# Patient Record
Sex: Female | Born: 1960
Health system: Southern US, Community
[De-identification: ages and names within clinical notes are randomized; demographics above are authoritative.]

## PROBLEM LIST (undated history)

## (undated) DIAGNOSIS — G43909 Migraine, unspecified, not intractable, without status migrainosus: Secondary | ICD-10-CM

## (undated) DIAGNOSIS — E079 Disorder of thyroid, unspecified: Secondary | ICD-10-CM

## (undated) DIAGNOSIS — E785 Hyperlipidemia, unspecified: Secondary | ICD-10-CM

## (undated) DIAGNOSIS — F32A Depression, unspecified: Secondary | ICD-10-CM

## (undated) DIAGNOSIS — M8580 Other specified disorders of bone density and structure, unspecified site: Secondary | ICD-10-CM

## (undated) DIAGNOSIS — N200 Calculus of kidney: Secondary | ICD-10-CM

## (undated) HISTORY — DX: Migraine, unspecified, not intractable, without status migrainosus: G43.909

## (undated) HISTORY — DX: Hyperlipidemia, unspecified: E78.5

## (undated) HISTORY — DX: Calculus of kidney: N20.0

## (undated) HISTORY — PX: NECK SURGERY: SHX720

## (undated) HISTORY — DX: Depression, unspecified: F32.A

## (undated) HISTORY — DX: Disorder of thyroid, unspecified: E07.9

## (undated) HISTORY — DX: Other specified disorders of bone density and structure, unspecified site: M85.80

---
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
## 1998-06-01 ENCOUNTER — Other Ambulatory Visit: Admission: RE | Admit: 1998-06-01 | Discharge: 1998-06-01 | Payer: Self-pay | Admitting: Obstetrics and Gynecology

## 1999-05-26 ENCOUNTER — Other Ambulatory Visit: Admission: RE | Admit: 1999-05-26 | Discharge: 1999-05-26 | Payer: Self-pay | Admitting: Obstetrics and Gynecology

## 1999-06-30 ENCOUNTER — Encounter: Admission: RE | Admit: 1999-06-30 | Discharge: 1999-06-30 | Payer: Self-pay | Admitting: Obstetrics and Gynecology

## 1999-06-30 ENCOUNTER — Encounter: Payer: Self-pay | Admitting: Obstetrics and Gynecology

## 2000-05-30 ENCOUNTER — Other Ambulatory Visit: Admission: RE | Admit: 2000-05-30 | Discharge: 2000-05-30 | Payer: Self-pay | Admitting: Obstetrics and Gynecology

## 2001-05-15 ENCOUNTER — Encounter: Payer: Self-pay | Admitting: Obstetrics and Gynecology

## 2001-05-15 ENCOUNTER — Encounter: Admission: RE | Admit: 2001-05-15 | Discharge: 2001-05-15 | Payer: Self-pay | Admitting: Obstetrics and Gynecology

## 2001-06-05 ENCOUNTER — Other Ambulatory Visit: Admission: RE | Admit: 2001-06-05 | Discharge: 2001-06-05 | Payer: Self-pay | Admitting: Obstetrics and Gynecology

## 2002-06-11 ENCOUNTER — Other Ambulatory Visit: Admission: RE | Admit: 2002-06-11 | Discharge: 2002-06-11 | Payer: Self-pay | Admitting: Obstetrics and Gynecology

## 2002-09-10 ENCOUNTER — Encounter: Admission: RE | Admit: 2002-09-10 | Discharge: 2002-09-10 | Payer: Self-pay | Admitting: Obstetrics and Gynecology

## 2002-09-10 ENCOUNTER — Encounter: Payer: Self-pay | Admitting: Obstetrics and Gynecology

## 2003-06-16 ENCOUNTER — Other Ambulatory Visit: Admission: RE | Admit: 2003-06-16 | Discharge: 2003-06-16 | Payer: Self-pay | Admitting: Obstetrics and Gynecology

## 2004-04-20 ENCOUNTER — Ambulatory Visit (HOSPITAL_COMMUNITY): Admission: RE | Admit: 2004-04-20 | Discharge: 2004-04-20 | Payer: Self-pay | Admitting: Obstetrics and Gynecology

## 2004-07-12 ENCOUNTER — Other Ambulatory Visit: Admission: RE | Admit: 2004-07-12 | Discharge: 2004-07-12 | Payer: Self-pay | Admitting: Obstetrics and Gynecology

## 2004-07-14 ENCOUNTER — Ambulatory Visit: Payer: Self-pay | Admitting: Gastroenterology

## 2004-08-10 ENCOUNTER — Ambulatory Visit: Payer: Self-pay | Admitting: Gastroenterology

## 2004-08-19 ENCOUNTER — Ambulatory Visit: Payer: Self-pay | Admitting: Gastroenterology

## 2005-07-20 ENCOUNTER — Other Ambulatory Visit: Admission: RE | Admit: 2005-07-20 | Discharge: 2005-07-20 | Payer: Self-pay | Admitting: Obstetrics and Gynecology

## 2005-08-09 ENCOUNTER — Ambulatory Visit (HOSPITAL_COMMUNITY): Admission: RE | Admit: 2005-08-09 | Discharge: 2005-08-09 | Payer: Self-pay | Admitting: Obstetrics and Gynecology

## 2006-08-29 ENCOUNTER — Other Ambulatory Visit: Admission: RE | Admit: 2006-08-29 | Discharge: 2006-08-29 | Payer: Self-pay | Admitting: Obstetrics and Gynecology

## 2008-03-25 ENCOUNTER — Other Ambulatory Visit: Admission: RE | Admit: 2008-03-25 | Discharge: 2008-03-25 | Payer: Self-pay | Admitting: Obstetrics and Gynecology

## 2008-04-02 ENCOUNTER — Encounter: Admission: RE | Admit: 2008-04-02 | Discharge: 2008-04-02 | Payer: Self-pay | Admitting: Obstetrics and Gynecology

## 2009-07-06 ENCOUNTER — Encounter (INDEPENDENT_AMBULATORY_CARE_PROVIDER_SITE_OTHER): Payer: Self-pay | Admitting: *Deleted

## 2009-10-14 ENCOUNTER — Encounter (INDEPENDENT_AMBULATORY_CARE_PROVIDER_SITE_OTHER): Payer: Self-pay | Admitting: *Deleted

## 2010-03-03 ENCOUNTER — Telehealth: Payer: Self-pay | Admitting: Gastroenterology

## 2010-06-30 NOTE — Letter (Signed)
Summary: Colonoscopy Letter  [facility]  [address] [place], [place] [address]   Phone: [phone]  Fax: [phone]      Oct 14, 2009 MRN: [id number]   [facility] [address] [place], [place]  [address]   Dear Ms. Bouchillon,   According to your medical record, it is time for you to schedule a Colonoscopy. The American Cancer Society recommends this procedure as a method to detect early colon cancer. Patients with a family history of colon cancer, or a personal history of colon polyps or inflammatory bowel disease are at increased risk.  This letter has beeen generated based on the recommendations made at the time of your procedure. If you feel that in your particular situation this may no longer apply, please contact our office.  Please call our office at [phone] to schedule this appointment or to update your records at your earliest convenience.  Thank you for cooperating with [place] to provide you with the very best care possible.   Sincerely,    Vania Rea. Jarold Motto, M.D.  [facility] Gastroenterology Division [phone]

## 2010-06-30 NOTE — Letter (Signed)
Summary: Colonoscopy Letter  [facility]  [address] [place], [place] [address]   Phone: [phone]  Fax: [phone]      July 06, 2009 MRN: [id number]   [facility] [address] [place], [place]  [address]   Dear Ms. Ricciardi,   According to your medical record, it is time for you to schedule a Colonoscopy. The American Cancer Society recommends this procedure as a method to detect early colon cancer. Patients with a family history of colon cancer, or a personal history of colon polyps or inflammatory bowel disease are at increased risk.  This letter has beeen generated based on the recommendations made at the time of your procedure. If you feel that in your particular situation this may no longer apply, please contact our office.  Please call our office at [phone] to schedule this appointment or to update your records at your earliest convenience.  Thank you for cooperating with [place] to provide you with the very best care possible.   Sincerely,  Vania Rea. Jarold Motto, M.D.  [facility] Gastroenterology Division [phone]

## 2010-06-30 NOTE — Progress Notes (Signed)
Summary: Schedule Colonoscopy  Phone Note Outgoing Call Call back at [facility] Phone [phone]   Call placed by: Harlow Mares CMA Duncan Dull),  March 03, 2010 3:13 PM Call placed to: Patient Summary of Call: Left a message on patients machine to call back. patient due for a colonoscopy Initial call taken by: Harlow Mares CMA Duncan Dull),  March 03, 2010 3:13 PM  Follow-up for Phone Call        Left a message on the patient machine to call back and schedule a previsit and procedure with our office. A letter will be mailed to the patient.   Follow-up by: Harlow Mares CMA Duncan Dull),  March 11, 2010 11:34 AM

## 2011-08-16 ENCOUNTER — Encounter: Payer: Self-pay | Admitting: Family Medicine

## 2011-08-23 ENCOUNTER — Ambulatory Visit (INDEPENDENT_AMBULATORY_CARE_PROVIDER_SITE_OTHER): Payer: 59 | Admitting: Family Medicine

## 2011-08-23 ENCOUNTER — Encounter: Payer: Self-pay | Admitting: Family Medicine

## 2011-08-23 VITALS — BP 108/69 | HR 61 | Temp 98.0°F | Resp 16 | Ht 67.0 in | Wt 149.6 lb

## 2011-08-23 DIAGNOSIS — Z0000 Encounter for general adult medical examination without abnormal findings: Secondary | ICD-10-CM

## 2011-08-23 LAB — CBC WITH DIFFERENTIAL/PLATELET
Basophils Absolute: 0 10*3/uL (ref 0.0–0.1)
Basophils Relative: 0 % (ref 0–1)
Eosinophils Absolute: 0.2 10*3/uL (ref 0.0–0.7)
Eosinophils Relative: 3 % (ref 0–5)
HCT: 41.9 % (ref 36.0–46.0)
Hemoglobin: 14 g/dL (ref 12.0–15.0)
Lymphocytes Relative: 33 % (ref 12–46)
Lymphs Abs: 1.9 10*3/uL (ref 0.7–4.0)
MCH: 31.7 pg (ref 26.0–34.0)
MCHC: 33.4 g/dL (ref 30.0–36.0)
MCV: 94.8 fL (ref 78.0–100.0)
Monocytes Absolute: 0.4 10*3/uL (ref 0.1–1.0)
Monocytes Relative: 8 % (ref 3–12)
Neutro Abs: 3.2 10*3/uL (ref 1.7–7.7)
Neutrophils Relative %: 57 % (ref 43–77)
Platelets: 289 10*3/uL (ref 150–400)
RBC: 4.42 MIL/uL (ref 3.87–5.11)
RDW: 13.6 % (ref 11.5–15.5)
WBC: 5.7 10*3/uL (ref 4.0–10.5)

## 2011-08-23 LAB — COMPREHENSIVE METABOLIC PANEL
ALT: 20 U/L (ref 0–35)
AST: 22 U/L (ref 0–37)
Albumin: 4.4 g/dL (ref 3.5–5.2)
Alkaline Phosphatase: 44 U/L (ref 39–117)
BUN: 13 mg/dL (ref 6–23)
CO2: 27 mEq/L (ref 19–32)
Calcium: 9.5 mg/dL (ref 8.4–10.5)
Chloride: 105 mEq/L (ref 96–112)
Creat: 0.79 mg/dL (ref 0.50–1.10)
Glucose, Bld: 78 mg/dL (ref 70–99)
Potassium: 4.5 mEq/L (ref 3.5–5.3)
Sodium: 141 mEq/L (ref 135–145)
Total Bilirubin: 0.5 mg/dL (ref 0.3–1.2)
Total Protein: 6.7 g/dL (ref 6.0–8.3)

## 2011-08-23 LAB — LIPID PANEL
LDL Cholesterol: 122 mg/dL — ABNORMAL HIGH (ref 0–99)
Total CHOL/HDL Ratio: 3.6 Ratio
VLDL: 14 mg/dL (ref 0–40)

## 2011-08-23 NOTE — Patient Instructions (Signed)
Shingles Vaccine What You Need to Know WHAT IS SHINGLES?  Shingles is a painful skin rash, often with blisters. It is also called Herpes Zoster or just Zoster.   A shingles rash usually appears on one side of the face or body and lasts from 2 to 4 weeks. Its main symptom is pain, which can be quite severe. Other symptoms of shingles can include fever, headache, chills, and upset stomach. Very rarely, a shingles infection can lead to pneumonia, hearing problems, blindness, brain inflammation (encephalitis), or death.   For about 1 person in 5, severe pain can continue even after the rash clears up. This is called post-herpetic neuralgia.   Shingles is caused by the Varicella Zoster virus. This is the same virus that causes chickenpox. Only someone who has had a case of chickenpox or rarely, has gotten chickenpox vaccine, can get shingles. The virus stays in your body. It can reappear many years later to cause a case of shingles.   You cannot catch shingles from another person with shingles. However, a person who has never had chickenpox (or chickenpox vaccine) could get chickenpox from someone with shingles. This is not very common.   Shingles is far more common in people 50 and older than in younger people. It is also more common in people whose immune systems are weakened because of a disease such as cancer or drugs such as steroids or chemotherapy.   At least 1 million people get shingles per year in the [place].  SHINGLES VACCINE  A vaccine for shingles was licensed in 2006. In clinical trials, the vaccine reduced the risk of shingles by 50%. It can also reduce the pain in people who still get shingles after being vaccinated.   A single dose of shingles vaccine is recommended for adults 60 years of age and older.  SOME PEOPLE SHOULD NOT GET SHINGLES VACCINE OR SHOULD WAIT A person should not get shingles vaccine if he or she:  Has ever had a life-threatening allergic reaction to  gelatin, the antibiotic neomycin, or any other component of shingles vaccine. Tell your caregiver if you have any severe allergies.   Has a weakened immune system because of current:   AIDS or another disease that affects the immune system.   Treatment with drugs that affect the immune system, such as prolonged use of high-dose steroids.   Cancer treatment, such as radiation or chemotherapy.   Cancer affecting the bone marrow or lymphatic system, such as leukemia or lymphoma.   Is pregnant, or might be pregnant. Women should not become pregnant until at least 4 weeks after getting shingles vaccine.  Someone with a minor illness, such as a cold, may be vaccinated. Anyone with a moderate or severe acute illness should usually wait until he or she recovers before getting the vaccine. This includes anyone with a temperature of 101.3 F (38 C) or higher. WHAT ARE THE RISKS FROM SHINGLES VACCINE?  A vaccine, like any medicine, could possibly cause serious problems, such as severe allergic reactions. However, the risk of a vaccine causing serious harm, or death, is extremely small.   No serious problems have been identified with shingles vaccine.  Mild Problems  Redness, soreness, swelling, or itching at the site of the injection (about 1 person in 3).   Headache (about 1 person in 70).  Like all vaccines, shingles vaccine is being closely monitored for unusual or severe problems. WHAT IF THERE IS A MODERATE OR SEVERE REACTION? What should I   look for? Any unusual condition, such as a severe allergic reaction or a high fever. If a severe allergic reaction occurred, it would be within a few minutes to an hour after the shot. Signs of a serious allergic reaction can include difficulty breathing, weakness, hoarseness or wheezing, a fast heartbeat, hives, dizziness, paleness, or swelling of the throat. What should I do?  Call your caregiver, or get the person to a caregiver right away.   Tell  the caregiver what happened, the date and time it happened, and when the vaccination was given.   Ask the caregiver to report the reaction by filing a Vaccine Adverse Event Reporting System (VAERS) form. Or, you can file this report through the VAERS web site at www.vaers.hhs.gov or by calling 1-800-822-7967.  VAERS does not provide medical advice. HOW CAN I LEARN MORE?  Ask your caregiver. He or she can give you the vaccine package insert or suggest other sources of information.   Contact the Centers for Disease Control and Prevention (CDC):   Call 1-800-232-4636 (1-800-CDC-INFO).   Visit the CDC website at www.cdc.gov/vaccines  CDC Shingles Vaccine VIS (03/03/08) Document Released: 03/12/2006 Document Revised: 05/04/2011 Document Reviewed: 03/03/2008 ExitCare Patient Information 2012 [employer]. 

## 2011-08-23 NOTE — Progress Notes (Signed)
  Subjective:    Patient ID: Katie Bond, female    DOB: February 14, 1961, 51 y.o.   MRN: [id number]  HPI  This 51 y.o. Cauc female is here for annual physical exam; she receives GYN care at [facility] with Dr. Genice Rouge (last PAP 2 years ago). She has some minor problems with rashes  but otherwise, is in very good health.    HCM:   Mammogram- 3-4 years ago (negative)               DEXA- 3 years ago (normal)               Colonoscopy: 2-3 years ago (normal)    Review of Systems  HENT: Negative.   Eyes: Negative.   Respiratory: Negative.   Cardiovascular: Negative.   Gastrointestinal: Negative.   Genitourinary: Negative.   Musculoskeletal: Negative.   Skin: Negative.   Neurological: Negative.   Hematological: Negative.   Psychiatric/Behavioral: Negative.        Objective:   Physical Exam  Nursing note and vitals reviewed. Constitutional: She is oriented to person, place, and time. She appears well-developed and well-nourished. No distress.  HENT:  Head: Normocephalic and atraumatic.  Right Ear: External ear normal.  Nose: Nose normal.  Mouth/Throat: Oropharynx is clear and moist.  Eyes: Conjunctivae and EOM are normal. Pupils are equal, round, and reactive to light. No scleral icterus.  Neck: Normal range of motion. Neck supple. No thyromegaly present.  Cardiovascular: Normal rate, regular rhythm, normal heart sounds and intact distal pulses.  Exam reveals no gallop.   No murmur heard. Pulmonary/Chest: Effort normal and breath sounds normal. No respiratory distress.  Abdominal: Soft. Bowel sounds are normal. She exhibits no distension and no mass. There is no tenderness.  Musculoskeletal: Normal range of motion. She exhibits no edema and no tenderness.  Lymphadenopathy:    She has no cervical adenopathy.  Neurological: She is alert and oriented to person, place, and time. She has normal reflexes. No cranial nerve deficit. Coordination normal.  Skin: Skin is  warm and dry.  Psychiatric: She has a normal mood and affect. Her behavior is normal. Judgment and thought content normal.          Assessment & Plan:   1. Routine general medical examination at a health care facility  CBC with Differential, Comprehensive metabolic panel, Lipid panel, Vitamin D 25 hydroxy   RX:  Zostavax x 1 dose given to pt.

## 2011-08-24 LAB — VITAMIN D 25 HYDROXY (VIT D DEFICIENCY, FRACTURES): Vit D, 25-Hydroxy: 36 ng/mL (ref 30–89)

## 2011-08-28 ENCOUNTER — Encounter: Payer: Self-pay | Admitting: Gastroenterology

## 2011-08-31 ENCOUNTER — Encounter: Payer: Self-pay | Admitting: Family Medicine

## 2011-08-31 DIAGNOSIS — Z0000 Encounter for general adult medical examination without abnormal findings: Secondary | ICD-10-CM | POA: Insufficient documentation

## 2011-08-31 DIAGNOSIS — Z87440 Personal history of urinary (tract) infections: Secondary | ICD-10-CM | POA: Insufficient documentation

## 2011-09-05 NOTE — Progress Notes (Signed)
Quick Note:  Please call pt and advise that the following labs are abnormal... LDL ("bad") cholesterol is a little elevated; improve nutrition by reducing fats and cholesterol in diet and increase activity level.  Vitamin D is in low normal range; a multivitamin with additional Vit D is a good idea. Also need to get 10-15 minutes of sun exposure most days of the week.  Try to eat more salmon and tuna and some dairy products that have been fortified with Vit D.  Provide copy of labs to pt. ______

## 2011-09-06 ENCOUNTER — Telehealth: Payer: Self-pay

## 2011-09-06 NOTE — Telephone Encounter (Signed)
pts is calling for lab results can now be reached at home number [phone]

## 2011-09-06 NOTE — Telephone Encounter (Signed)
Patient was seen for a physical and wants to know lab results. Please call back at [phone] (cell )

## 2011-09-06 NOTE — Telephone Encounter (Signed)
lmom to CB 

## 2011-09-07 NOTE — Telephone Encounter (Signed)
Katie Bond spoke with pt this am and discussed labs, please see lab results.

## 2011-10-25 ENCOUNTER — Other Ambulatory Visit: Payer: Self-pay | Admitting: Obstetrics and Gynecology

## 2011-10-25 DIAGNOSIS — Z1231 Encounter for screening mammogram for malignant neoplasm of breast: Secondary | ICD-10-CM

## 2011-11-10 ENCOUNTER — Ambulatory Visit
Admission: RE | Admit: 2011-11-10 | Discharge: 2011-11-10 | Disposition: A | Discharge status: Home / Self Care | Payer: 59 | Source: Ambulatory Visit | Attending: Obstetrics and Gynecology | Admitting: Obstetrics and Gynecology

## 2011-11-10 DIAGNOSIS — Z1231 Encounter for screening mammogram for malignant neoplasm of breast: Secondary | ICD-10-CM

## 2012-10-16 ENCOUNTER — Encounter: Payer: Self-pay | Admitting: Certified Nurse Midwife

## 2012-10-22 ENCOUNTER — Encounter: Payer: Self-pay | Admitting: Certified Nurse Midwife

## 2012-10-22 ENCOUNTER — Ambulatory Visit (INDEPENDENT_AMBULATORY_CARE_PROVIDER_SITE_OTHER): Payer: 59 | Admitting: Certified Nurse Midwife

## 2012-10-22 VITALS — BP 94/52 | HR 60 | Ht 66.0 in | Wt 149.0 lb

## 2012-10-22 DIAGNOSIS — Z0000 Encounter for general adult medical examination without abnormal findings: Secondary | ICD-10-CM

## 2012-10-22 DIAGNOSIS — Z01419 Encounter for gynecological examination (general) (routine) without abnormal findings: Secondary | ICD-10-CM

## 2012-10-22 LAB — POCT URINALYSIS DIPSTICK
Bilirubin, UA: NEGATIVE
Blood, UA: NEGATIVE
Glucose, UA: NEGATIVE
Ketones, UA: NEGATIVE
Nitrite, UA: NEGATIVE
Protein, UA: NEGATIVE
Urobilinogen, UA: NEGATIVE
pH, UA: 5

## 2012-10-22 NOTE — Patient Instructions (Addendum)
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
## 2012-10-23 LAB — HEMOGLOBIN, FINGERSTICK: Hemoglobin, fingerstick: 13.2 g/dL (ref 12.0–16.0)

## 2013-04-17 ENCOUNTER — Telehealth: Payer: Self-pay | Admitting: Pulmonary Disease

## 2013-04-17 NOTE — Telephone Encounter (Signed)
I spoke with pt and advised her SN is not accepting new pts d/t retiring next year. Nothing further needed

## 2013-06-04 ENCOUNTER — Telehealth: Payer: Self-pay

## 2013-06-04 ENCOUNTER — Telehealth: Payer: Self-pay | Admitting: Certified Nurse Midwife

## 2013-06-04 NOTE — Telephone Encounter (Signed)
Spoke with patient. Last CPE was March 27,2013. Transferred to appt center to schedule CPE appt.

## 2013-06-04 NOTE — Telephone Encounter (Signed)
Patient wants to know who Debbi would recommend for a PCP

## 2013-06-04 NOTE — Telephone Encounter (Signed)
That is fine 

## 2013-06-04 NOTE — Telephone Encounter (Signed)
NEEDS TO KNOW DATE OF LAST CPE. CB# [phone]

## 2013-06-04 NOTE — Telephone Encounter (Signed)
Return call to patient, notified our practice frequently recommends Dr Lynne LoganPang's office and Dr Rosezella FloridaPanosh's office and numbers given.   Is this ok or is there another recommendation?

## 2013-06-09 ENCOUNTER — Ambulatory Visit (INDEPENDENT_AMBULATORY_CARE_PROVIDER_SITE_OTHER): Payer: 59 | Admitting: Physician Assistant

## 2013-06-09 ENCOUNTER — Encounter: Payer: Self-pay | Admitting: Physician Assistant

## 2013-06-09 VITALS — BP 118/62 | HR 72 | Temp 98.0°F | Resp 16 | Ht 66.0 in | Wt 147.0 lb

## 2013-06-09 DIAGNOSIS — M79609 Pain in unspecified limb: Secondary | ICD-10-CM

## 2013-06-09 DIAGNOSIS — S161XXA Strain of muscle, fascia and tendon at neck level, initial encounter: Secondary | ICD-10-CM

## 2013-06-09 DIAGNOSIS — M79602 Pain in left arm: Secondary | ICD-10-CM

## 2013-06-09 DIAGNOSIS — S139XXA Sprain of joints and ligaments of unspecified parts of neck, initial encounter: Secondary | ICD-10-CM

## 2013-06-09 DIAGNOSIS — Z0000 Encounter for general adult medical examination without abnormal findings: Secondary | ICD-10-CM

## 2013-06-09 LAB — LIPID PANEL
CHOL/HDL RATIO: 3.4 ratio
Cholesterol: 178 mg/dL (ref 0–200)
HDL: 53 mg/dL (ref >39)
LDL CALC: 112 mg/dL — AB (ref 0–99)
TRIGLYCERIDES: 65 mg/dL (ref <150)
VLDL: 13 mg/dL (ref 0–40)

## 2013-06-09 LAB — COMPREHENSIVE METABOLIC PANEL
ALK PHOS: 41 U/L (ref 39–117)
ALT: 24 U/L (ref 0–35)
AST: 22 U/L (ref 0–37)
Albumin: 4.4 g/dL (ref 3.5–5.2)
BILIRUBIN TOTAL: 0.6 mg/dL (ref 0.3–1.2)
BUN: 11 mg/dL (ref 6–23)
CO2: 29 mEq/L (ref 19–32)
Calcium: 9.6 mg/dL (ref 8.4–10.5)
Chloride: 106 mEq/L (ref 96–112)
Creat: 0.71 mg/dL (ref 0.50–1.10)
GLUCOSE: 87 mg/dL (ref 70–99)
Potassium: 4.4 mEq/L (ref 3.5–5.3)
SODIUM: 141 meq/L (ref 135–145)
TOTAL PROTEIN: 6.8 g/dL (ref 6.0–8.3)

## 2013-06-09 LAB — POCT UA - MICROSCOPIC ONLY
Bacteria, U Microscopic: NEGATIVE
CRYSTALS, UR, HPF, POC: NEGATIVE
Casts, Ur, LPF, POC: NEGATIVE
Mucus, UA: NEGATIVE
RBC, URINE, MICROSCOPIC: NEGATIVE
Yeast, UA: NEGATIVE

## 2013-06-09 LAB — CBC
HCT: 41.1 % (ref 36.0–46.0)
HEMOGLOBIN: 13.8 g/dL (ref 12.0–15.0)
MCH: 30.4 pg (ref 26.0–34.0)
MCHC: 33.6 g/dL (ref 30.0–36.0)
MCV: 90.5 fL (ref 78.0–100.0)
Platelets: 290 10*3/uL (ref 150–400)
RBC: 4.54 MIL/uL (ref 3.87–5.11)
RDW: 14.2 % (ref 11.5–15.5)
WBC: 4.3 10*3/uL (ref 4.0–10.5)

## 2013-06-09 LAB — POCT URINALYSIS DIPSTICK
Bilirubin, UA: NEGATIVE
Glucose, UA: NEGATIVE
KETONES UA: NEGATIVE
Nitrite, UA: NEGATIVE
PROTEIN UA: NEGATIVE
Spec Grav, UA: 1.01
UROBILINOGEN UA: 0.2
pH, UA: 7.5

## 2013-06-09 LAB — TSH: TSH: 2.817 u[IU]/mL (ref 0.350–4.500)

## 2013-06-09 LAB — SEDIMENTATION RATE: SED RATE: 4 mm/h (ref 0–22)

## 2013-06-09 MED ORDER — CYCLOBENZAPRINE HCL 5 MG PO TABS: 5.0000 mg | ORAL_TABLET | Freq: Three times a day (TID) | ORAL | Status: DC | PRN
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
## 2013-06-09 NOTE — Patient Instructions (Signed)

## 2013-06-10 LAB — VITAMIN D 25 HYDROXY (VIT D DEFICIENCY, FRACTURES): Vit D, 25-Hydroxy: 33 ng/mL (ref 30–89)

## 2013-06-18 ENCOUNTER — Encounter: Payer: Self-pay | Admitting: Certified Nurse Midwife

## 2013-07-03 ENCOUNTER — Ambulatory Visit (HOSPITAL_COMMUNITY)
Admission: RE | Admit: 2013-07-03 | Discharge: 2013-07-03 | Disposition: A | Discharge status: Home / Self Care | Payer: 59 | Source: Ambulatory Visit | Attending: Physician Assistant | Admitting: Physician Assistant

## 2013-07-03 ENCOUNTER — Other Ambulatory Visit: Payer: Self-pay | Admitting: Physician Assistant

## 2013-07-03 DIAGNOSIS — Z0000 Encounter for general adult medical examination without abnormal findings: Secondary | ICD-10-CM

## 2013-07-03 DIAGNOSIS — Z1231 Encounter for screening mammogram for malignant neoplasm of breast: Secondary | ICD-10-CM

## 2013-10-23 ENCOUNTER — Ambulatory Visit: Payer: 59 | Admitting: Certified Nurse Midwife

## 2013-11-04 ENCOUNTER — Ambulatory Visit (INDEPENDENT_AMBULATORY_CARE_PROVIDER_SITE_OTHER): Payer: 59 | Admitting: Certified Nurse Midwife

## 2013-11-04 ENCOUNTER — Encounter: Payer: Self-pay | Admitting: Certified Nurse Midwife

## 2013-11-04 VITALS — BP 112/68 | HR 80 | Resp 16 | Ht 66.25 in | Wt 151.0 lb

## 2013-11-04 DIAGNOSIS — Z01419 Encounter for gynecological examination (general) (routine) without abnormal findings: Secondary | ICD-10-CM

## 2013-11-04 DIAGNOSIS — Z124 Encounter for screening for malignant neoplasm of cervix: Secondary | ICD-10-CM

## 2013-11-04 NOTE — Patient Instructions (Signed)
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
## 2013-11-06 LAB — IPS PAP TEST WITH REFLEX TO HPV

## 2013-11-08 NOTE — Progress Notes (Signed)
Reviewed personally.  M. Suzanne Malakai Schoenherr, MD.  

## 2014-02-07 ENCOUNTER — Ambulatory Visit (INDEPENDENT_AMBULATORY_CARE_PROVIDER_SITE_OTHER): Payer: 59 | Admitting: Family Medicine

## 2014-02-07 VITALS — BP 130/78 | HR 79 | Temp 98.1°F | Resp 16 | Ht 66.0 in | Wt 149.6 lb

## 2014-02-07 DIAGNOSIS — R300 Dysuria: Secondary | ICD-10-CM

## 2014-02-07 DIAGNOSIS — N3090 Cystitis, unspecified without hematuria: Secondary | ICD-10-CM

## 2014-02-07 LAB — POCT URINALYSIS DIPSTICK
BILIRUBIN UA: NEGATIVE
GLUCOSE UA: NEGATIVE
Ketones, UA: NEGATIVE
Nitrite, UA: NEGATIVE
Protein, UA: NEGATIVE
Urobilinogen, UA: 0.2
pH, UA: 5

## 2014-02-07 LAB — POCT UA - MICROSCOPIC ONLY
BACTERIA, U MICROSCOPIC: NEGATIVE
Casts, Ur, LPF, POC: NEGATIVE
Crystals, Ur, HPF, POC: NEGATIVE
Mucus, UA: NEGATIVE
RBC, URINE, MICROSCOPIC: NEGATIVE
Yeast, UA: NEGATIVE

## 2014-02-07 MED ORDER — NITROFURANTOIN MONOHYD MACRO 100 MG PO CAPS: 100.0000 mg | ORAL_CAPSULE | Freq: Two times a day (BID) | ORAL | Status: DC

## 2014-02-07 NOTE — Patient Instructions (Signed)

## 2014-02-07 NOTE — Progress Notes (Signed)
   Subjective:    Patient ID: Katie Bond, female    DOB: 02/19/1961, 53 y.o.   MRN: [id number]  HPI Patient presents today with 12 hours of dysuria, urinary frequency. She has a history of UTIs, but hasn't had any for about 2 years. She had a couple of loose stools a couple of days ago following eating some spicy foods.    Review of Systems Few chills this morning, no fever, no back pain, no abdominal pain, slight nausea (attributes this to not having eaten this morning). No vaginal discharge, burning or itching.    Objective:   Physical Exam  Vitals reviewed. Constitutional: She is oriented to person, place, and time. She appears well-developed and well-nourished.  HENT:  Head: Normocephalic and atraumatic.  Eyes: Conjunctivae are normal.  Neck: Normal range of motion. Neck supple.  Cardiovascular: Normal rate, regular rhythm and normal heart sounds.   Pulmonary/Chest: Effort normal and breath sounds normal.  Abdominal: Soft. Bowel sounds are normal. There is tenderness in the suprapubic area. There is no CVA tenderness.  Musculoskeletal: Normal range of motion.  Neurological: She is alert and oriented to person, place, and time.  Skin: Skin is warm and dry.  Psychiatric: She has a normal mood and affect. Her behavior is normal. Judgment and thought content normal.   Results for orders placed in visit on 02/07/14  POCT UA - MICROSCOPIC ONLY      Result Value Ref Range   WBC, Ur, HPF, POC 0-1     RBC, urine, microscopic neg     Bacteria, U Microscopic neg     Mucus, UA neg     Epithelial cells, urine per micros 0-1     Crystals, Ur, HPF, POC neg     Casts, Ur, LPF, POC neg     Yeast, UA neg    POCT URINALYSIS DIPSTICK      Result Value Ref Range   Color, UA yellow     Clarity, UA clear     Glucose, UA neg     Bilirubin, UA neg     Ketones, UA neg     Spec Grav, UA <=1.005     Blood, UA trace-intact     pH, UA 5.0     Protein, UA neg     Urobilinogen, UA 0.2     Nitrite, UA neg     Leukocytes, UA Trace        Assessment & Plan:  1. Dysuria - POCT UA - Microscopic Only - POCT urinalysis dipstick  2. Cystitis -Urine with trace blood, trace leukocytes.  - Urine culture - nitrofurantoin, macrocrystal-monohydrate, (MACROBID) 100 MG capsule; Take 1 capsule (100 mg total) by mouth 2 (two) times daily.  Dispense: 20 capsule; Refill: 0 -Provided written and verbal information regarding diagnosis and treatment.  Emi Belfast, FNP-BC  [facility], [facility]  02/07/2014 8:45 AM

## 2014-02-10 LAB — URINE CULTURE

## 2014-03-05 ENCOUNTER — Ambulatory Visit (INDEPENDENT_AMBULATORY_CARE_PROVIDER_SITE_OTHER): Payer: 59 | Admitting: Emergency Medicine

## 2014-03-05 VITALS — BP 118/68 | HR 75 | Temp 98.5°F | Resp 18 | Ht 66.0 in | Wt 150.0 lb

## 2014-03-05 DIAGNOSIS — N3090 Cystitis, unspecified without hematuria: Secondary | ICD-10-CM

## 2014-03-05 DIAGNOSIS — R350 Frequency of micturition: Secondary | ICD-10-CM

## 2014-03-05 DIAGNOSIS — H9319 Tinnitus, unspecified ear: Secondary | ICD-10-CM

## 2014-03-05 LAB — POCT URINALYSIS DIPSTICK
Bilirubin, UA: NEGATIVE
Blood, UA: NEGATIVE
Glucose, UA: NEGATIVE
Ketones, UA: NEGATIVE
Nitrite, UA: NEGATIVE
Protein, UA: NEGATIVE
Spec Grav, UA: 1.01
UROBILINOGEN UA: 0.2
pH, UA: 7

## 2014-03-05 LAB — POCT UA - MICROSCOPIC ONLY
Bacteria, U Microscopic: NEGATIVE
Casts, Ur, LPF, POC: NEGATIVE
Crystals, Ur, HPF, POC: NEGATIVE
EPITHELIAL CELLS, URINE PER MICROSCOPY: NEGATIVE
MUCUS UA: NEGATIVE
RBC, urine, microscopic: NEGATIVE
YEAST UA: NEGATIVE

## 2014-03-05 MED ORDER — PHENAZOPYRIDINE HCL 200 MG PO TABS: 200.0000 mg | ORAL_TABLET | Freq: Three times a day (TID) | ORAL | Status: DC | PRN

## 2014-03-05 MED ORDER — NITROFURANTOIN MONOHYD MACRO 100 MG PO CAPS: 100.0000 mg | ORAL_CAPSULE | Freq: Two times a day (BID) | ORAL | Status: DC

## 2014-03-05 NOTE — Patient Instructions (Signed)
Tinnitus °Sounds you hear in your ears and coming from within the ear is called tinnitus. This can be a symptom of many ear disorders. It is often associated with hearing loss.  °Tinnitus can be seen with: °· Infections. °· Ear blockages such as wax buildup. °· Meniere's disease. °· Ear damage. °· Inherited. °· Occupational causes. °While irritating, it is not usually a threat to health. When the cause of the tinnitus is wax, infection in the middle ear, or foreign body it is easily treated. Hearing loss will usually be reversible.  °TREATMENT  °When treating the underlying cause does not get rid of tinnitus, it may be necessary to get rid of the unwanted sound by covering it up with more pleasant background noises. This may include music, the radio etc. There are tinnitus maskers which can be worn which produce background noise to cover up the tinnitus. °Avoid all medications which tend to make tinnitus worse such as alcohol, caffeine, aspirin, and nicotine. There are many soothing background tapes such as rain, ocean, thunderstorms, etc. These soothing sounds help with sleeping or resting. °Keep all follow-up appointments and referrals. This is important to identify the cause of the problem. It also helps avoid complications, impaired hearing, disability, or chronic pain. °Document Released: 05/15/2005 Document Revised: 08/07/2011 Document Reviewed: 01/01/2008 °ExitCare® Patient Information ©2015 [employer]. This information is not intended to replace advice given to you by your health care provider. Make sure you discuss any questions you have with your health care provider. ° °
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
## 2014-03-17 ENCOUNTER — Telehealth: Payer: Self-pay

## 2014-03-17 NOTE — Telephone Encounter (Signed)
Pt states she was seen for a UTI and BLADDER INF. It hasn't cleared up all the way and she is now in [place] and would like to have some more antibiotics called in and will be in for a recheck [place]  You may reach pt at [phone] if needed    The pharmacy phone number is [phone]

## 2014-03-17 NOTE — Telephone Encounter (Signed)
Patient is in [place] and wanted to be prescribed an antibiotic for a UTI. Dr. Patsy Lageropland spoke with her and she was informed that we don't have a licence to prescribe in [place]. She was told to go to urgent care or ER there

## 2014-03-23 ENCOUNTER — Ambulatory Visit (INDEPENDENT_AMBULATORY_CARE_PROVIDER_SITE_OTHER): Payer: 59 | Admitting: Certified Nurse Midwife

## 2014-03-23 ENCOUNTER — Encounter: Payer: Self-pay | Admitting: Certified Nurse Midwife

## 2014-03-23 VITALS — BP 110/70 | HR 70 | Resp 16 | Ht 66.25 in | Wt 151.0 lb

## 2014-03-23 DIAGNOSIS — L723 Sebaceous cyst: Secondary | ICD-10-CM

## 2014-03-23 DIAGNOSIS — R140 Abdominal distension (gaseous): Secondary | ICD-10-CM

## 2014-03-23 DIAGNOSIS — R160 Hepatomegaly, not elsewhere classified: Secondary | ICD-10-CM

## 2014-03-23 LAB — POCT URINALYSIS DIPSTICK
BILIRUBIN UA: NEGATIVE
Glucose, UA: NEGATIVE
Ketones, UA: NEGATIVE
LEUKOCYTES UA: NEGATIVE
Nitrite, UA: NEGATIVE
PH UA: 5
Protein, UA: NEGATIVE
RBC UA: NEGATIVE
UROBILINOGEN UA: NEGATIVE

## 2014-03-23 NOTE — Progress Notes (Signed)
53 y.o. married wihite female g1p1001 here with copy of US done in [place] two weeks ago. Patient was visiting and had been treated at Urgent care x 2 for UTI which occurred again shortly after arriving in [place].. Was seen in clinic and told her problem was kidney stone. Patient has increased water and taking Pyridium given there and the symptoms have stopper. She is here to ask where she needs to go to follow up with this. Urine here today negative. She was also told she an enlarged liver and would like referral to GI for evaluation. SHe also has noted a bump in the vulva area she would like checked. Denies any urinary symptoms today. No other issues. Denies any vaginal bleeding. O: Healthy female WDWN Affect: Normal, orientation x 3 Skin : warm and dry CVAT: negative bilateral Abdomen: negative for suprapubic tenderness  Pelvic exam: External genital area: normal, no lesions, small sebaceous cyst noted on right vulva area. Shown area to patient. Bladder,Urethra, Urethral meatus: non tender  Vagina: normal vaginal discharge, normal appearance  Wet prep Cervix: normal, non tender Uterus:normal,non tender Adnexa: normal non tender, no fullness or masses   A:History of UTI and diagnosis of Kidney stone, urine negative today Diagnosis per US of enlarged liver, asymptomatic, CMP normal 1/15 Sebaceous cyst  P: Discussed with patient she needs to make appointment with urology to follow kidney stone. She plans to call [facility]. Aware of warnings with kidney stones. Discussed referral to Dr. Loreta AveMann for evaluation of liver indicated, patient would like to have this [place] the [place] as follow [place]. Will referr and advise patient of appointment. Discussed finding of sebaceous cyst and benign finding. No treatment needed.  Rv prn

## 2014-03-23 NOTE — Patient Instructions (Signed)

## 2014-03-24 NOTE — Progress Notes (Signed)
Reviewed personally.  M. Suzanne Pratik Dalziel, MD.  

## 2014-03-30 ENCOUNTER — Encounter: Payer: Self-pay | Admitting: Certified Nurse Midwife

## 2014-10-21 ENCOUNTER — Ambulatory Visit (INDEPENDENT_AMBULATORY_CARE_PROVIDER_SITE_OTHER): Payer: 59 | Admitting: Family Medicine

## 2014-10-21 ENCOUNTER — Encounter: Payer: Self-pay | Admitting: Family Medicine

## 2014-10-21 VITALS — BP 109/68 | HR 70 | Temp 98.6°F | Resp 16 | Ht 66.5 in | Wt 141.8 lb

## 2014-10-21 DIAGNOSIS — Z131 Encounter for screening for diabetes mellitus: Secondary | ICD-10-CM

## 2014-10-21 DIAGNOSIS — R4589 Other symptoms and signs involving emotional state: Secondary | ICD-10-CM

## 2014-10-21 DIAGNOSIS — R110 Nausea: Secondary | ICD-10-CM

## 2014-10-21 DIAGNOSIS — Z13220 Encounter for screening for lipoid disorders: Secondary | ICD-10-CM

## 2014-10-21 DIAGNOSIS — Z0000 Encounter for general adult medical examination without abnormal findings: Secondary | ICD-10-CM

## 2014-10-21 DIAGNOSIS — H9313 Tinnitus, bilateral: Secondary | ICD-10-CM

## 2014-10-21 DIAGNOSIS — F489 Nonpsychotic mental disorder, unspecified: Secondary | ICD-10-CM

## 2014-10-21 DIAGNOSIS — Z1329 Encounter for screening for other suspected endocrine disorder: Secondary | ICD-10-CM

## 2014-10-21 LAB — CBC WITH DIFFERENTIAL/PLATELET
BASOS ABS: 0 10*3/uL (ref 0.0–0.1)
BASOS PCT: 0 % (ref 0–1)
Eosinophils Absolute: 0.2 10*3/uL (ref 0.0–0.7)
Eosinophils Relative: 3 % (ref 0–5)
HEMATOCRIT: 43.2 % (ref 36.0–46.0)
HEMOGLOBIN: 14.2 g/dL (ref 12.0–15.0)
Lymphocytes Relative: 26 % (ref 12–46)
Lymphs Abs: 1.6 10*3/uL (ref 0.7–4.0)
MCH: 30.1 pg (ref 26.0–34.0)
MCHC: 32.9 g/dL (ref 30.0–36.0)
MCV: 91.7 fL (ref 78.0–100.0)
MONO ABS: 0.4 10*3/uL (ref 0.1–1.0)
MONOS PCT: 7 % (ref 3–12)
MPV: 9 fL (ref 8.6–12.4)
NEUTROS ABS: 3.8 10*3/uL (ref 1.7–7.7)
Neutrophils Relative %: 64 % (ref 43–77)
Platelets: 294 10*3/uL (ref 150–400)
RBC: 4.71 MIL/uL (ref 3.87–5.11)
RDW: 13.5 % (ref 11.5–15.5)
WBC: 6 10*3/uL (ref 4.0–10.5)

## 2014-10-21 LAB — COMPREHENSIVE METABOLIC PANEL
ALT: 23 U/L (ref 0–35)
AST: 20 U/L (ref 0–37)
Albumin: 4.5 g/dL (ref 3.5–5.2)
Alkaline Phosphatase: 44 U/L (ref 39–117)
BUN: 14 mg/dL (ref 6–23)
CALCIUM: 9.7 mg/dL (ref 8.4–10.5)
CO2: 27 mEq/L (ref 19–32)
Chloride: 104 mEq/L (ref 96–112)
Creat: 0.75 mg/dL (ref 0.50–1.10)
Glucose, Bld: 90 mg/dL (ref 70–99)
POTASSIUM: 5.4 meq/L — AB (ref 3.5–5.3)
SODIUM: 139 meq/L (ref 135–145)
Total Bilirubin: 0.5 mg/dL (ref 0.2–1.2)
Total Protein: 7.3 g/dL (ref 6.0–8.3)

## 2014-10-21 LAB — FOLLICLE STIMULATING HORMONE: FSH: 89.2 m[IU]/mL

## 2014-10-21 LAB — VITAMIN B12: VITAMIN B 12: 578 pg/mL (ref 211–911)

## 2014-10-21 LAB — POCT URINALYSIS DIPSTICK
Bilirubin, UA: NEGATIVE
GLUCOSE UA: NEGATIVE
Ketones, UA: NEGATIVE
Nitrite, UA: NEGATIVE
Protein, UA: NEGATIVE
Spec Grav, UA: <=1.005
UROBILINOGEN UA: 0.2
pH, UA: 7

## 2014-10-21 LAB — TSH: TSH: 1.346 u[IU]/mL (ref 0.350–4.500)

## 2014-10-21 LAB — HEMOGLOBIN A1C
HEMOGLOBIN A1C: 5.5 % (ref <5.7)
MEAN PLASMA GLUCOSE: 111 mg/dL (ref <117)

## 2014-10-21 LAB — LIPID PANEL
CHOLESTEROL: 210 mg/dL — AB (ref 0–200)
HDL: 49 mg/dL (ref >=46)
LDL CALC: 150 mg/dL — AB (ref 0–99)
Total CHOL/HDL Ratio: 4.3 Ratio
Triglycerides: 55 mg/dL (ref <150)
VLDL: 11 mg/dL (ref 0–40)

## 2014-10-21 LAB — LUTEINIZING HORMONE: LH: 39.1 m[IU]/mL
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
## 2014-10-21 NOTE — Progress Notes (Signed)
   Subjective:    Patient ID: Katie Bond, female    DOB: [birth year], 54 y.o.   MRN: [id number]  HPI    Review of Systems  Constitutional: Negative.   HENT: Positive for tinnitus.   Eyes: Negative.   Respiratory: Negative.   Cardiovascular: Negative.   Gastrointestinal: Positive for nausea.  Endocrine: Negative.   Genitourinary: Negative.   Musculoskeletal: Negative.   Skin: Negative.   Allergic/Immunologic: Negative.   Neurological: Negative.   Hematological: Negative.   Psychiatric/Behavioral: Negative.        Objective:   Physical Exam        Assessment & Plan:

## 2014-10-21 NOTE — Patient Instructions (Addendum)
1.  STOP COFFEE FOR TWO WEEKS TO SEE IF NAUSEA IMPROVES; IF NAUSEA PERSISTS, CALL OFFICE. 2.  INCREASE EXERCISE TO HELP WITH STRESS MANAGEMENT. 3.  PLEASE SCHEDULE MAMMOGRAM APPOINTMENT.  Keeping You Healthy  Get These Tests  Blood Pressure- Have your blood pressure checked by your healthcare provider at least once a year.  Normal blood pressure is 120/80.  Weight- Have your body mass index (BMI) calculated to screen for obesity.  BMI is a measure of body fat based on height and weight.  You can calculate your own BMI at https://www.west-esparza.com/www.nhlbisupport.com/bmi/  Cholesterol- Have your cholesterol checked every year.  Diabetes- Have your blood sugar checked every year if you have high blood pressure, high cholesterol, a family history of diabetes or if you are overweight.  Pap Test - Have a pap test every 1 to 5 years if you have been sexually active.  If you are older than 65 and recent pap tests have been normal you may not need additional pap tests.  In addition, if you have had a hysterectomy  for benign disease additional pap tests are not necessary.  Mammogram-Yearly mammograms are essential for early detection of breast cancer  Screening for Colon Cancer- Colonoscopy starting at age 56. Screening may begin sooner depending on your family history and other health conditions.  Follow up colonoscopy as directed by your Gastroenterologist.  Screening for Osteoporosis- Screening begins at age 56 with bone density scanning, sooner if you are at higher risk for developing Osteoporosis.  Get these medicines  Calcium with Vitamin D- Your body requires 1200-1500 mg of Calcium a day and [phone] IU of Vitamin D a day.  You can only absorb 500 mg of Calcium at a time therefore Calcium must be taken in 2 or 3 separate doses throughout the day.  Hormones- Hormone therapy has been associated with increased risk for certain cancers and heart disease.  Talk to your healthcare provider about if you need relief from  menopausal symptoms.  Aspirin- Ask your healthcare provider about taking Aspirin to prevent Heart Disease and Stroke.  Get these Immuniztions  Flu shot- Every fall  Pneumonia shot- Once after the age of 56; if you are younger ask your healthcare provider if you need a pneumonia shot.  Tetanus- Every ten years.  Zostavax- Once after the age of 56 to prevent shingles.  Take these steps  Don't smoke- Your healthcare provider can help you quit. For tips on how to quit, ask your healthcare provider or go to www.smokefree.gov or call 1-800 QUIT-NOW.  Be physically active- Exercise 5 days a week for a minimum of 30 minutes.  If you are not already physically active, start slow and gradually work up to 30 minutes of moderate physical activity.  Try walking, dancing, bike riding, swimming, etc.  Eat a healthy diet- Eat a variety of healthy foods such as fruits, vegetables, whole grains, low fat milk, low fat cheeses, yogurt, lean meats, chicken, fish, eggs, dried beans, tofu, etc.  For more information go to www.thenutritionsource.org  Dental visit- Brush and floss teeth twice daily; visit your dentist twice a year.  Eye exam- Visit your Optometrist or Ophthalmologist yearly.  Drink alcohol in moderation- Limit alcohol intake to one drink or less a day.  Never drink and drive.  Depression- Your emotional health is as important as your physical health.  If you're feeling down or losing interest in things you normally enjoy, please talk to your healthcare provider.  Seat Belts- can save  your life; always wear one  Smoke/Carbon Monoxide detectors- These detectors need to be installed on the appropriate level of your home.  Replace batteries at least once a year.  Violence- If anyone is threatening or hurting you, please tell your healthcare provider.  Living Will/ Health care power of attorney- Discuss with your healthcare provider and family.

## 2014-10-22 LAB — VITAMIN D 25 HYDROXY (VIT D DEFICIENCY, FRACTURES): Vit D, 25-Hydroxy: 26 ng/mL — ABNORMAL LOW (ref 30–100)

## 2014-11-11 ENCOUNTER — Encounter: Payer: Self-pay | Admitting: Certified Nurse Midwife

## 2014-11-11 ENCOUNTER — Ambulatory Visit (INDEPENDENT_AMBULATORY_CARE_PROVIDER_SITE_OTHER): Payer: 59 | Admitting: Certified Nurse Midwife

## 2014-11-11 VITALS — BP 110/60 | HR 50 | Resp 16 | Ht 66.0 in | Wt 143.8 lb

## 2014-11-11 DIAGNOSIS — Z01419 Encounter for gynecological examination (general) (routine) without abnormal findings: Secondary | ICD-10-CM
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
## 2014-11-11 NOTE — Patient Instructions (Addendum)

## 2014-11-12 NOTE — Progress Notes (Signed)
Reviewed personally.  M. Suzanne Guillermo Difrancesco, MD.  

## 2015-08-04 ENCOUNTER — Ambulatory Visit (INDEPENDENT_AMBULATORY_CARE_PROVIDER_SITE_OTHER): Payer: 59 | Admitting: Family Medicine

## 2015-08-04 VITALS — BP 100/64 | HR 69 | Temp 98.6°F | Resp 16 | Ht 66.0 in | Wt 142.0 lb

## 2015-08-04 DIAGNOSIS — R05 Cough: Secondary | ICD-10-CM

## 2015-08-04 DIAGNOSIS — J209 Acute bronchitis, unspecified: Secondary | ICD-10-CM

## 2015-08-04 DIAGNOSIS — R059 Cough, unspecified: Secondary | ICD-10-CM

## 2015-08-04 MED ORDER — BENZONATATE 200 MG PO CAPS: 200.0000 mg | ORAL_CAPSULE | Freq: Two times a day (BID) | ORAL | Status: DC | PRN

## 2015-08-04 MED ORDER — AZITHROMYCIN 250 MG PO TABS: ORAL_TABLET | ORAL | Status: DC

## 2015-08-04 MED ORDER — PREDNISONE 20 MG PO TABS: ORAL_TABLET | ORAL | Status: DC

## 2015-08-04 NOTE — Progress Notes (Signed)
55 yo woman with unrelenting cough x 8 days, beginning with fever.  Now having muscle aches.  Nonproductive.  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 Cough 786.2 R05 azithromycin (ZITHROMAX) 250 MG tablet     predniSONE (DELTASONE) 20 MG tablet     benzonatate (TESSALON) 200 MG capsule     Signed, Elvina SidleKurt Soliana Kitko, MD

## 2015-08-04 NOTE — Patient Instructions (Signed)

## 2015-11-24 ENCOUNTER — Encounter: Payer: Self-pay | Admitting: Certified Nurse Midwife

## 2015-11-24 ENCOUNTER — Ambulatory Visit (INDEPENDENT_AMBULATORY_CARE_PROVIDER_SITE_OTHER): Payer: 59 | Admitting: Certified Nurse Midwife

## 2015-11-24 VITALS — BP 100/60 | HR 70 | Resp 20 | Ht 65.75 in | Wt 144.0 lb

## 2015-11-24 DIAGNOSIS — Z0000 Encounter for general adult medical examination without abnormal findings: Secondary | ICD-10-CM | POA: Diagnosis not present

## 2015-11-24 DIAGNOSIS — R6882 Decreased libido: Secondary | ICD-10-CM

## 2015-11-24 DIAGNOSIS — Z124 Encounter for screening for malignant neoplasm of cervix: Secondary | ICD-10-CM

## 2015-11-24 DIAGNOSIS — N952 Postmenopausal atrophic vaginitis: Secondary | ICD-10-CM | POA: Diagnosis not present

## 2015-11-24 DIAGNOSIS — N951 Menopausal and female climacteric states: Secondary | ICD-10-CM

## 2015-11-24 DIAGNOSIS — Z01419 Encounter for gynecological examination (general) (routine) without abnormal findings: Secondary | ICD-10-CM | POA: Diagnosis not present

## 2015-11-24 LAB — POCT URINALYSIS DIPSTICK
BILIRUBIN UA: NEGATIVE
GLUCOSE UA: NEGATIVE
Ketones, UA: NEGATIVE
Nitrite, UA: NEGATIVE
Protein, UA: NEGATIVE
Urobilinogen, UA: NEGATIVE
pH, UA: 6

## 2015-11-24 NOTE — Addendum Note (Signed)
Addended by: Verner CholLEONARD, DEBORAH S on: 11/24/2015 04:40 PM   Modules accepted: Kipp BroodSmartSet
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
## 2015-11-24 NOTE — Progress Notes (Signed)
Reviewed personally.  M. Suzanne Shantell Belongia, MD.  

## 2015-11-24 NOTE — Patient Instructions (Signed)
Atrophic Vaginitis Atrophic vaginitis is a condition in which the tissues that line the vagina become dry and thin. This condition is most common in women who have stopped having regular menstrual periods (menopause). This usually starts when a woman is 45-57 years old. Estrogen helps to keep the vagina moist. It stimulates the vagina to produce a clear fluid that lubricates the vagina for sexual intercourse. This fluid also protects the vagina from infection. Lack of estrogen can cause the lining of the vagina to get thinner and dryer. The vagina may also shrink in size. It may become less elastic. Atrophic vaginitis tends to get worse over time as a woman's estrogen level drops. CAUSES This condition is caused by the normal drop in estrogen that happens around the time of menopause. RISK FACTORS Certain conditions or situations may lower a woman's estrogen level, which increases her risk of atrophic vaginitis. These include:  Taking medicine that blocks estrogen.  Having ovaries removed surgically.  Being treated for cancer with X-ray treatment (radiation) or medicines (chemotherapy).  Exercising very hard and often.  Having an eating disorder (anorexia).  Giving birth or breastfeeding.  Being over the age of 50.  Smoking. SYMPTOMS Symptoms of this condition include:  Pain, soreness, or bleeding during sexual intercourse (dyspareunia).  Vaginal burning, irritation, or itching.  Pain or bleeding during a vaginal examination using a speculum (pelvic exam).  Loss of interest in sexual activity.  Having burning pain when passing urine.  Vaginal discharge that is brown or yellow. In some cases, there are no symptoms. DIAGNOSIS This condition is diagnosed with a medical history and physical exam. This will include a pelvic exam that checks whether the inside of your vagina appears pale, thin, or dry. Rarely, you may also have other tests, including:  A urine test.  A test that  checks the acid balance in your vaginal fluid (acid balance test). TREATMENT Treatment for this condition may depend on the severity of your symptoms. Treatment may include:  Using an over-the-counter vaginal lubricant before you have sexual intercourse.  Using a long-acting vaginal moisturizer.  Using low-dose vaginal estrogen for moderate to severe symptoms that do not respond to other treatments. Options include creams, tablets, and inserts (vaginal rings). Before using vaginal estrogen, tell your health care provider if you have a history of:  Breast cancer.  Endometrial cancer.  Blood clots.  Taking medicines. You may be able to take a daily pill for dyspareunia. Discuss all of the risks of this medicine with your health care provider. It is usually not recommended for women who have a family history or personal history of breast cancer. If your symptoms are very mild and you are not sexually active, you may not need treatment. HOME CARE INSTRUCTIONS  Take medicines only as directed by your health care provider. Do not use herbal or alternative medicines unless your health care provider says that you can.  Use over-the-counter creams, lubricants, or moisturizers for dryness only as directed by your health care provider.  If your atrophic vaginitis is caused by menopause, discuss all of your menopausal symptoms and treatment options with your health care provider.  Do not douche.  Do not use products that can make your vagina dry. These include:  Scented feminine sprays.  Scented tampons.  Scented soaps.  If it hurts to have sex, talk with your sexual partner. SEEK MEDICAL CARE IF:  Your discharge looks different than normal.  Your vagina has an unusual smell.  You have   new symptoms.  Your symptoms do not improve with treatment.  Your symptoms get worse.   This information is not intended to replace advice given to you by your health care provider. Make sure you  discuss any questions you have with your health care provider.   Document Released: 09/29/2014 Document Reviewed: 09/29/2014 Elsevier Interactive Patient Education 2016 [employer]  EXERCISE AND DIET:  We recommended that you start or continue a regular exercise program for good health. Regular exercise means any activity that makes your heart beat faster and makes you sweat.  We recommend exercising at least 30 minutes per day at least 3 days a week, preferably 4 or 5.  We also recommend a diet low in fat and sugar.  Inactivity, poor dietary choices and obesity can cause diabetes, heart attack, stroke, and kidney damage, among others.    ALCOHOL AND SMOKING:  Women should limit their alcohol intake to no more than 7 drinks/beers/glasses of wine (combined, not each!) per week. Moderation of alcohol intake to this level decreases your risk of breast cancer and liver damage. And of course, no recreational drugs are part of a healthy lifestyle.  And absolutely no smoking or even second hand smoke. Most people know smoking can cause heart and lung diseases, but did you know it also contributes to weakening of your bones? Aging of your skin?  Yellowing of your teeth and nails?  CALCIUM AND VITAMIN D:  Adequate intake of calcium and Vitamin D are recommended.  The recommendations for exact amounts of these supplements seem to change often, but generally speaking 600 mg of calcium (either carbonate or citrate) and 800 units of Vitamin D per day seems prudent. Certain women may benefit from higher intake of Vitamin D.  If you are among these women, your doctor will have told you during your visit.    PAP SMEARS:  Pap smears, to check for cervical cancer or precancers,  have traditionally been done yearly, although recent scientific advances have shown that most women can have pap smears less often.  However, every woman still should have a physical exam from her gynecologist every year. It will include a  breast check, inspection of the vulva and vagina to check for abnormal growths or skin changes, a visual exam of the cervix, and then an exam to evaluate the size and shape of the uterus and ovaries.  And after 57 years of age, a rectal exam is indicated to check for rectal cancers. We will also provide age appropriate advice regarding health maintenance, like when you should have certain vaccines, screening for sexually transmitted diseases, bone density testing, colonoscopy, mammograms, etc.   MAMMOGRAMS:  All women over 57 years old should have a yearly mammogram. Many facilities now offer a "3D" mammogram, which may cost around $50 extra out of pocket. If possible,  we recommend you accept the option to have the 3D mammogram performed.  It both reduces the number of women who will be called back for extra views which then turn out to be normal, and it is better than the routine mammogram at detecting truly abnormal areas.    COLONOSCOPY:  Colonoscopy to screen for colon cancer is recommended for all women at age 57.  We know, you hate the idea of the prep.  We agree, BUT, having colon cancer and not knowing it is worse!!  Colon cancer so often starts as a polyp that can be seen and removed at colonscopy, which can quite literally  save your life!  And if your first colonoscopy is normal and you have no family history of colon cancer, most women don't have to have it again for 10 years.  Once every ten years, you can do something that may end up saving your life, right?  We will be happy to help you get it scheduled when you are ready.  Be sure to check your insurance coverage so you understand how much it will cost.  It may be covered as a preventative service at no cost, but you should check your particular policy.      Be sure and schedule mammogram Have a great day! Debbi

## 2015-11-25 ENCOUNTER — Other Ambulatory Visit: Payer: Self-pay | Admitting: Certified Nurse Midwife

## 2015-11-25 DIAGNOSIS — Z1231 Encounter for screening mammogram for malignant neoplasm of breast: Secondary | ICD-10-CM

## 2015-11-25 LAB — FSH/LH
FSH: 90.5 m[IU]/mL
LH: 31.9 m[IU]/mL

## 2015-11-25 LAB — TSH: TSH: 1.43 mIU/L

## 2015-11-25 LAB — ESTRADIOL: Estradiol: <15 pg/mL

## 2015-11-25 LAB — TESTOSTERONE: Testosterone: 30 ng/dL

## 2015-11-25 LAB — DHEA-SULFATE: DHEA-SO4: 88 ug/dL (ref 8–188)

## 2015-11-25 LAB — CORTISOL: Cortisol, Plasma: 9.2 ug/dL

## 2015-11-26 LAB — IPS PAP TEST WITH HPV

## 2015-11-27 LAB — 17-HYDROXYPROGESTERONE: 17-OH-PROGESTERONE, LC/MS/MS: 10 ng/dL

## 2015-12-01 ENCOUNTER — Ambulatory Visit
Admission: RE | Admit: 2015-12-01 | Discharge: 2015-12-01 | Disposition: A | Discharge status: Home / Self Care | Payer: Commercial Managed Care - HMO | Source: Ambulatory Visit | Attending: Certified Nurse Midwife | Admitting: Certified Nurse Midwife

## 2015-12-01 DIAGNOSIS — Z1231 Encounter for screening mammogram for malignant neoplasm of breast: Secondary | ICD-10-CM

## 2015-12-24 ENCOUNTER — Telehealth: Payer: Self-pay

## 2015-12-24 ENCOUNTER — Other Ambulatory Visit: Payer: Self-pay | Admitting: Certified Nurse Midwife

## 2015-12-24 DIAGNOSIS — N951 Menopausal and female climacteric states: Secondary | ICD-10-CM

## 2015-12-24 NOTE — Telephone Encounter (Signed)
Lab order placed.

## 2015-12-24 NOTE — Telephone Encounter (Signed)
Please place lab order

## 2015-12-24 NOTE — Telephone Encounter (Signed)
Per note from prevo drug rx sent for & pt set up lab appt.

## 2016-03-17 ENCOUNTER — Ambulatory Visit (INDEPENDENT_AMBULATORY_CARE_PROVIDER_SITE_OTHER): Payer: 59 | Admitting: Physician Assistant

## 2016-03-17 VITALS — BP 126/72 | HR 72 | Temp 97.9°F | Resp 16 | Ht 65.75 in | Wt 147.8 lb

## 2016-03-17 DIAGNOSIS — R8299 Other abnormal findings in urine: Secondary | ICD-10-CM

## 2016-03-17 DIAGNOSIS — R82998 Other abnormal findings in urine: Secondary | ICD-10-CM

## 2016-03-17 DIAGNOSIS — R350 Frequency of micturition: Secondary | ICD-10-CM | POA: Diagnosis not present

## 2016-03-17 LAB — POCT URINALYSIS DIP (MANUAL ENTRY)
Bilirubin, UA: NEGATIVE
Glucose, UA: NEGATIVE
Ketones, POC UA: NEGATIVE
Nitrite, UA: NEGATIVE
Protein Ur, POC: NEGATIVE
Spec Grav, UA: <=1.005
Urobilinogen, UA: 0.2
pH, UA: 7

## 2016-03-17 LAB — POC MICROSCOPIC URINALYSIS (UMFC): Mucus: ABSENT

## 2016-03-17 MED ORDER — NITROFURANTOIN MONOHYD MACRO 100 MG PO CAPS: 100.0000 mg | ORAL_CAPSULE | Freq: Two times a day (BID) | ORAL | 0 refills | Status: DC

## 2016-03-17 NOTE — Patient Instructions (Signed)
     IF you received an x-ray today, you will receive an invoice from [facility]. Please contact [facility] at [phone] with questions or concerns regarding your invoice.   IF you received labwork today, you will receive an invoice from [facility]. Please contact [facility] at [phone] with questions or concerns regarding your invoice.   Our billing staff will not be able to assist you with questions regarding bills from these companies.  You will be contacted with the lab results as soon as they are available. The fastest way to get your results is to activate your My Chart account. Instructions are located on the last page of this paperwork. If you have not heard from us regarding the results in 2 weeks, please contact this office.      

## 2016-03-17 NOTE — Progress Notes (Signed)
Patient ID: Katie Bond, female   DOB: 06/26/1960, 55 y.o.   MRN: [id number]   By signing my name below, I, Essence Howell, attest that this documentation has been prepared under the direction and in the presence of Deliah Boston, PA-C Electronically Signed: Charline Bills, ED Scribe 03/17/2016 at 9:16 AM.  03/17/2016 9:43 AM   DOB: 14-Aug-1960 / MRN: [id number]  SUBJECTIVE:  Katie Bond is a 55 y.o. female presenting for urinary frequency starting today. She has a h/o recurrent UTI. Per urine, appears that she had a UTI back in 11/24/15. She had 1 urine culture in 2015 that shows that she grew out E.coli. Vitals are normal. Pt reports associated symptoms of mild dysuria and mild low back discomfort onset today. She states that symptoms feel similar to previous UTIs. She has tried drinking plenty water but no medications tried. Pt denies fever, changes in urine, malodor, vaginal discharge, constipation. Pt is followed by her gynecologist Leota Sauers annually.   She complains of rash associated with keflex, cipro, and septra.   She is allergic to cephalexin; ciprofloxacin; codeine; and septra [sulfamethoxazole-trimethoprim].   She  has a past medical history of Kidney stone and Migraines.    She  reports that she quit smoking about 28 years ago. She has a 11.00 pack-year smoking history. She has never used smokeless tobacco. She reports that she drinks about 1.2 oz of alcohol per week . She reports that she does not use drugs. She  reports that she currently engages in sexual activity and has had female partners. She reports using the following method of birth control/protection: Other-see comments. The patient  has a past surgical history that includes Tonsillectomy (1979) and Mandible fracture surgery (1978).  Her family history includes Alzheimer's disease in her father; Cancer (age of onset: 60) in her father; Cancer (age of onset: 41) in her maternal grandmother; Hyperlipidemia in her  mother; Mental illness in her brother.  Review of Systems  Constitutional: Negative for fever.  Gastrointestinal: Negative for constipation.  Genitourinary: Positive for dysuria (mild) and frequency.  Musculoskeletal: Positive for back pain (mild).    The problem list and medications were reviewed and updated by myself where necessary and exist elsewhere in the encounter.   OBJECTIVE:  Lab Results  Component Value Date   CREATININE 0.75 10/21/2014    BP 126/72   Pulse 72   Temp 97.9 F (36.6 C) (Oral)   Resp 16   Ht 5' 5.75" (1.67 m)   Wt 147 lb 12.8 oz (67 kg)   LMP 05/30/2007   BMI 24.04 kg/m   Physical Exam  Constitutional: She is oriented to person, place, and time. She appears well-developed and well-nourished.  Appears uncomfortable.   Cardiovascular: Normal rate.   Pulmonary/Chest: Effort normal.  Abdominal: Soft. Bowel sounds are normal. She exhibits no mass. There is no tenderness. There is no guarding and no CVA tenderness.  No flank pain  Neurological: She is alert and oriented to person, place, and time.  Psychiatric: She has a normal mood and affect. Her behavior is normal.   Results for orders placed or performed in visit on 03/17/16 (from the past 72 hour(s))  POCT Microscopic Urinalysis ([facility])     Status: None   Collection Time: 03/17/16  9:23 AM  Result Value Ref Range   WBC,UR,HPF,POC None None WBC/hpf   RBC,UR,HPF,POC None None RBC/hpf   Bacteria None None, Too numerous to count   Mucus Absent Absent   Epithelial  Cells, UR Per Microscopy None None, Too numerous to count cells/hpf  POCT urinalysis dipstick     Status: Abnormal   Collection Time: 03/17/16  9:24 AM  Result Value Ref Range   Color, UA yellow yellow   Clarity, UA clear clear   Glucose, UA negative negative   Bilirubin, UA negative negative   Ketones, POC UA negative negative   Spec Grav, UA <=1.005    Blood, UA small (A) negative   pH, UA 7.0    Protein Ur, POC negative  negative   Urobilinogen, UA 0.2    Nitrite, UA Negative Negative   Leukocytes, UA small (1+) (A) Negative    No results found.  ASSESSMENT AND PLAN  Chip BoerVicki was seen today for urinary frequency and chills.  Diagnoses and all orders for this visit:  Urinary frequency -     POCT Microscopic Urinalysis ([facility]) -     POCT urinalysis dipstick -     Urine culture  Urine leukocytes -     nitrofurantoin, macrocrystal-monohydrate, (MACROBID) 100 MG capsule; Take 1 capsule (100 mg total) by mouth 2 (two) times daily.   This note was scribed in my presence and I performed the services described in the this documentation.   The patient is advised to call or return to clinic if she does not see an improvement in symptoms, or to seek the care of the closest emergency department if she worsens with the above plan.   Deliah BostonMichael Clark, MHS, PA-C [facility] 03/17/2016 9:43 AM

## 2016-03-18 LAB — URINE CULTURE: ORGANISM ID, BACTERIA: NO GROWTH

## 2016-03-22 ENCOUNTER — Other Ambulatory Visit: Payer: 59

## 2016-03-22 ENCOUNTER — Telehealth: Payer: Self-pay | Admitting: Certified Nurse Midwife

## 2016-03-22 NOTE — Telephone Encounter (Signed)
Patient canceled appointment for labs today. She did try the medication but it broke her out in a rash. She did stop the medication and restarted 4 weeks later and the same thing happened again. She stopped the medication and has not taken it again.

## 2016-11-30 ENCOUNTER — Ambulatory Visit (INDEPENDENT_AMBULATORY_CARE_PROVIDER_SITE_OTHER): Payer: 59 | Admitting: Certified Nurse Midwife

## 2016-11-30 ENCOUNTER — Encounter: Payer: Self-pay | Admitting: Certified Nurse Midwife

## 2016-11-30 VITALS — BP 98/62 | HR 64 | Resp 16 | Ht 66.25 in | Wt 153.0 lb

## 2016-11-30 DIAGNOSIS — N951 Menopausal and female climacteric states: Secondary | ICD-10-CM

## 2016-11-30 DIAGNOSIS — Z01419 Encounter for gynecological examination (general) (routine) without abnormal findings: Secondary | ICD-10-CM | POA: Diagnosis not present

## 2016-11-30 NOTE — Patient Instructions (Signed)
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
## 2016-12-09 ENCOUNTER — Ambulatory Visit (INDEPENDENT_AMBULATORY_CARE_PROVIDER_SITE_OTHER): Payer: 59 | Admitting: Physician Assistant

## 2016-12-09 ENCOUNTER — Encounter: Payer: Self-pay | Admitting: Physician Assistant

## 2016-12-09 VITALS — BP 106/69 | HR 57 | Temp 98.2°F | Resp 16 | Ht 66.0 in | Wt 149.6 lb

## 2016-12-09 DIAGNOSIS — Z1159 Encounter for screening for other viral diseases: Secondary | ICD-10-CM | POA: Diagnosis not present

## 2016-12-09 DIAGNOSIS — Z13220 Encounter for screening for lipoid disorders: Secondary | ICD-10-CM | POA: Diagnosis not present

## 2016-12-09 DIAGNOSIS — Z0000 Encounter for general adult medical examination without abnormal findings: Secondary | ICD-10-CM | POA: Diagnosis not present

## 2016-12-09 DIAGNOSIS — Z131 Encounter for screening for diabetes mellitus: Secondary | ICD-10-CM | POA: Diagnosis not present

## 2016-12-09 DIAGNOSIS — Z1329 Encounter for screening for other suspected endocrine disorder: Secondary | ICD-10-CM

## 2016-12-09 DIAGNOSIS — Z130 Encounter for screening for diseases of the blood and blood-forming organs and certain disorders involving the immune mechanism: Secondary | ICD-10-CM | POA: Diagnosis not present

## 2016-12-09 DIAGNOSIS — Z1321 Encounter for screening for nutritional disorder: Secondary | ICD-10-CM

## 2016-12-09 DIAGNOSIS — Z114 Encounter for screening for human immunodeficiency virus [HIV]: Secondary | ICD-10-CM

## 2016-12-09 DIAGNOSIS — Z1389 Encounter for screening for other disorder: Secondary | ICD-10-CM | POA: Diagnosis not present

## 2016-12-09 DIAGNOSIS — K5730 Diverticulosis of large intestine without perforation or abscess without bleeding: Secondary | ICD-10-CM | POA: Insufficient documentation

## 2016-12-09 NOTE — Progress Notes (Signed)
12/09/2016 8:32 AM   DOB: 1961/05/29 / MRN: [id number]  SUBJECTIVE:  Katie Bond is a 56 y.o. female presenting for an annual physical and has no complaints today. PAP in [facility] from 6/28 and normal. She is receving mammograms every other year with birads cat 1 findings. Colonoscopy done in 2016 shouws multiple diverticula with a 5 year return rec. Distant history of smoking with 11 pack year history. Moderate drinker. Denies family history of osteoporosis. With regard to exercise, she tells me she works out for about 30 minutes 3 times a week via cardiovascular and weight bearing exercise.  She does not sweat. No family history of CAD or stroke.  Father had colon cancer but was a life long smoker and drinker.   Immunization History  Administered Date(s) Administered  . Tdap 12/23/2008    Health Maintenance Due  Topic  . Hepatitis C Screening   . HIV Screening     She is allergic to cephalexin; ciprofloxacin; codeine; and septra [sulfamethoxazole-trimethoprim].   She  has a past medical history of Kidney stone and Migraines.    She  reports that she quit smoking about 29 years ago. She has a 11.00 pack-year smoking history. She has never used smokeless tobacco. She reports that she drinks about 1.2 oz of alcohol per week . She reports that she does not use drugs. She  reports that she does not engage in sexual activity. The patient  has a past surgical history that includes Tonsillectomy (1979) and Mandible fracture surgery (1978).  Her family history includes Alzheimer's disease in her father; Cancer (age of onset: 2) in her father; Cancer (age of onset: 12) in her maternal grandmother; Hyperlipidemia in her mother; Mental illness in her brother.  Review of Systems  Constitutional: Negative for chills, diaphoresis and fever.  Eyes: Negative.   Respiratory: Negative for cough, hemoptysis, sputum production, shortness of breath and wheezing.   Cardiovascular: Negative for chest pain,  orthopnea and leg swelling.  Gastrointestinal: Negative for abdominal pain, blood in stool, constipation, diarrhea, heartburn, melena, nausea and vomiting.  Genitourinary: Negative for dysuria, flank pain, frequency, hematuria and urgency.  Skin: Negative for rash.  Neurological: Negative for dizziness, sensory change, speech change, focal weakness and headaches.    Problem list and medications reviewed and updated by myself where necessary, and exist elsewhere in the encounter.   OBJECTIVE:  BP 106/69   Pulse (!) 57   Temp 98.2 F (36.8 C) (Oral)   Resp 16   Ht '5\' 6"'  (1.676 m)   Wt 149 lb 9.6 oz (67.9 kg)   LMP 05/30/2007   SpO2 99%   BMI 24.15 kg/m   Physical Exam  Constitutional: She is oriented to person, place, and time. She appears well-developed and well-nourished. She is active.  Non-toxic appearance.  HENT:  Right Ear: Hearing, tympanic membrane, external ear and ear canal normal.  Left Ear: Hearing, tympanic membrane, external ear and ear canal normal.  Nose: Nose normal. Right sinus exhibits no maxillary sinus tenderness and no frontal sinus tenderness. Left sinus exhibits no maxillary sinus tenderness and no frontal sinus tenderness.  Mouth/Throat: Uvula is midline, oropharynx is clear and moist and mucous membranes are normal. Mucous membranes are not dry. No oropharyngeal exudate, posterior oropharyngeal edema or tonsillar abscesses.  Eyes: Pupils are equal, round, and reactive to light. EOM are normal.  Cardiovascular: Normal rate, regular rhythm, S1 normal, S2 normal, normal heart sounds and intact distal pulses.  Exam reveals no gallop,  no friction rub and no decreased pulses.   No murmur heard. Pulmonary/Chest: Effort normal. No stridor. No tachypnea. No respiratory distress. She has no wheezes. She has no rales.  Abdominal: She exhibits no distension.  Musculoskeletal: She exhibits no edema.  Lymphadenopathy:       Head (right side): No submandibular and no  tonsillar adenopathy present.       Head (left side): No submandibular and no tonsillar adenopathy present.    She has no cervical adenopathy.  Neurological: She is alert and oriented to person, place, and time. She has normal strength and normal reflexes. She is not disoriented. She displays no atrophy. No cranial nerve deficit or sensory deficit. She exhibits normal muscle tone. Coordination and gait normal.  Skin: Skin is warm and dry. She is not diaphoretic. No pallor.  Psychiatric: Her behavior is normal.    Lab Results  Component Value Date   WBC 6.0 10/21/2014   HGB 14.2 10/21/2014   HCT 43.2 10/21/2014   MCV 91.7 10/21/2014   PLT 294 10/21/2014    Lab Results  Component Value Date   NA 139 10/21/2014   K 5.4 (H) 10/21/2014   CL 104 10/21/2014   CO2 27 10/21/2014    Lab Results  Component Value Date   CREATININE 0.75 10/21/2014    Lab Results  Component Value Date   ALT 23 10/21/2014   AST 20 10/21/2014   ALKPHOS 44 10/21/2014   BILITOT 0.5 10/21/2014    Lab Results  Component Value Date   TSH 1.43 11/24/2015    Lab Results  Component Value Date   HGBA1C 5.5 10/21/2014   Lab Results  Component Value Date   CHOL 210 (H) 10/21/2014   HDL 49 10/21/2014   LDLCALC 150 (H) 10/21/2014   TRIG 55 10/21/2014   CHOLHDL 4.3 10/21/2014    ASSESSMENT AND PLAN  Katie Bond was seen today for annual exam.  Diagnoses and all orders for this visit:  Annual physical exam:  ASCVD risk at 1.8 using most recent lipids.  Screening and immunizations current aside from HIV and Hep C. Will complete those today and repeat routine screenings.   Screening for deficiency anemia -     CBC  Screening for nephropathy -     CMP14+EGFR  Screening for lipid disorders -     Lipid panel  Screening for thyroid disorder -     TSH  Screening for diabetes mellitus -     Hemoglobin A1c  Screening for HIV (human immunodeficiency virus) -     HIV antibody  Need for hepatitis C  screening test -     Hepatitis C antibody  Encounter for vitamin deficiency screening -     Vitamin D, 25-hydroxy    The patient was advised to call or return to clinic if she does not see an improvement in symptoms or to seek the care of the closest emergency department if she worsens with the above plan.   Philis Fendt, MHS, PA-C [facility] 12/09/2016 8:32 AM

## 2016-12-09 NOTE — Patient Instructions (Signed)
     IF you received an x-ray today, you will receive an invoice from [facility]. Please contact [facility] at [phone] with questions or concerns regarding your invoice.   IF you received labwork today, you will receive an invoice from [facility]. Please contact [facility] at [phone] with questions or concerns regarding your invoice.   Our billing staff will not be able to assist you with questions regarding bills from these companies.  You will be contacted with the lab results as soon as they are available. The fastest way to get your results is to activate your My Chart account. Instructions are located on the last page of this paperwork. If you have not heard from us regarding the results in 2 weeks, please contact this office.     

## 2016-12-13 LAB — CBC
HEMATOCRIT: 43.2 % (ref 34.0–46.6)
Hemoglobin: 13.8 g/dL (ref 11.1–15.9)
MCH: 28.9 pg (ref 26.6–33.0)
MCHC: 31.9 g/dL (ref 31.5–35.7)
MCV: 91 fL (ref 79–97)
PLATELETS: 300 10*3/uL (ref 150–379)
RBC: 4.77 x10E6/uL (ref 3.77–5.28)
RDW: 14 % (ref 12.3–15.4)
WBC: 4.7 10*3/uL (ref 3.4–10.8)

## 2016-12-13 LAB — LIPID PANEL
CHOL/HDL RATIO: 3.8 ratio (ref 0.0–4.4)
Cholesterol, Total: 220 mg/dL — ABNORMAL HIGH (ref 100–199)
HDL: 58 mg/dL (ref >39)
LDL Calculated: 144 mg/dL — ABNORMAL HIGH (ref 0–99)
TRIGLYCERIDES: 89 mg/dL (ref 0–149)
VLDL Cholesterol Cal: 18 mg/dL (ref 5–40)

## 2016-12-13 LAB — CMP14+EGFR
A/G RATIO: 2.1 (ref 1.2–2.2)
ALBUMIN: 4.6 g/dL (ref 3.5–5.5)
ALK PHOS: 44 IU/L (ref 39–117)
ALT: 29 IU/L (ref 0–32)
AST: 24 IU/L (ref 0–40)
BILIRUBIN TOTAL: 0.5 mg/dL (ref 0.0–1.2)
BUN / CREAT RATIO: 14 (ref 9–23)
BUN: 11 mg/dL (ref 6–24)
CHLORIDE: 105 mmol/L (ref 96–106)
CO2: 16 mmol/L — AB (ref 20–29)
Calcium: 9.3 mg/dL (ref 8.7–10.2)
Creatinine, Ser: 0.79 mg/dL (ref 0.57–1.00)
GFR calc Af Amer: 96 mL/min/{1.73_m2} (ref >59)
GFR calc non Af Amer: 83 mL/min/{1.73_m2} (ref >59)
GLOBULIN, TOTAL: 2.2 g/dL (ref 1.5–4.5)
Glucose: 90 mg/dL (ref 65–99)
POTASSIUM: 4.4 mmol/L (ref 3.5–5.2)
SODIUM: 143 mmol/L (ref 134–144)
Total Protein: 6.8 g/dL (ref 6.0–8.5)

## 2016-12-13 LAB — HEMOGLOBIN A1C
ESTIMATED AVERAGE GLUCOSE: 103 mg/dL
HEMOGLOBIN A1C: 5.2 % (ref 4.8–5.6)

## 2016-12-13 LAB — HEPATITIS C ANTIBODY: Hep C Virus Ab: <0.1 s/co ratio (ref 0.0–0.9)

## 2016-12-13 LAB — HIV ANTIBODY (ROUTINE TESTING W REFLEX): HIV SCREEN 4TH GENERATION: NONREACTIVE

## 2016-12-13 LAB — VITAMIN D 25 HYDROXY (VIT D DEFICIENCY, FRACTURES): VIT D 25 HYDROXY: 29.7 ng/mL — AB (ref 30.0–100.0)

## 2016-12-13 LAB — TSH: TSH: 1.43 u[IU]/mL (ref 0.450–4.500)

## 2017-07-06 ENCOUNTER — Other Ambulatory Visit: Payer: Self-pay

## 2017-07-06 ENCOUNTER — Encounter: Payer: Self-pay | Admitting: Certified Nurse Midwife

## 2017-07-06 ENCOUNTER — Ambulatory Visit: Payer: 59 | Admitting: Certified Nurse Midwife

## 2017-07-06 VITALS — BP 114/78 | HR 68 | Resp 16 | Ht 66.25 in | Wt 150.0 lb

## 2017-07-06 DIAGNOSIS — K648 Other hemorrhoids: Secondary | ICD-10-CM | POA: Diagnosis not present

## 2017-07-06 NOTE — Progress Notes (Signed)
Subjective:     Patient ID: Katie Bond, female   DOB: 1961/04/24, 57 y.o.   MRN: [id number]  57 yo here complaining of enlarged black lump at rectal opening. Denies rectal bleeding or rectal pain, had colonoscopy 2016 which is to be repeated in 5 years. Denies hard stools, eating normal and drinks water frequently. Denies trauma to area, sits at job for long periods of time. Patient's father had colon cancer and she is worried that this is what she is seeing in rectum area. Eats well and drinks large amount of water. Just would like to know" what this is".     Review of Systems  Constitutional: Negative.  Negative for activity change and appetite change.  HENT: Negative for sinus pressure.   Gastrointestinal: Negative for abdominal distention, abdominal pain, blood in stool, constipation, diarrhea, nausea and vomiting.  Genitourinary: Negative for pelvic pain, vaginal bleeding, vaginal discharge and vaginal pain.       Objective:   Physical Exam  Constitutional: She is oriented to person, place, and time. She appears well-developed and well-nourished.  Genitourinary:     Genitourinary Comments: Inspected perineal area, with no protrusions noted or lesions. Anal area normal appearance, no obvious blood.Tiny internal hemorrhoid noted only, no unusual tissue appearance or growths  Noted.  Neurological: She is alert and oriented to person, place, and time.  Skin: Skin is warm and dry.  Psychiatric: She has a normal mood and affect. Her behavior is normal. Judgment and thought content normal.       Assessment:     Internal non thrombosed hemorrhoid Colonoscopy 2016 with Diverticulosis noted otherwise negative, repeat 5 years    Plan:     Discussed findings with patient, no indication of unusual appearance other than hemorrhoid. Etiology can be prolonged sitting or standing and age related too.  Questions addressed. Discussed good diet of roughage, avoid straining, use foot stool  if needed in bathroom. Change positions at job frequently. Warning signs of bleeding, pain or increase in appearance needs to advise or see Dr. Loreta AveMann.  If she is still concerned after today's visit advise to cal Dr. Loreta AveMann office for follow up. Thankful for information and explanation.  Rv prn

## 2017-07-06 NOTE — Patient Instructions (Signed)
Hemorrhoids    Hemorrhoids are swollen veins in and around the rectum or anus. Hemorrhoids can cause pain, itching, or bleeding. Most of the time, they do not cause serious problems. They usually get better with diet changes, lifestyle changes, and other home treatments.  Follow these instructions at home:  Eating and drinking  · Eat foods that have fiber, such as whole grains, beans, nuts, fruits, and vegetables. Ask your doctor about taking products that have added fiber (fiber supplements).  · Drink enough fluid to keep your pee (urine) clear or pale yellow.  For Pain and Swelling  · Take a warm-water bath (sitz bath) for 20 minutes to ease pain. Do this 3-4 times a day.  · If directed, put ice on the painful area. It may be helpful to use ice between your warm baths.  ¨ Put ice in a plastic bag.  ¨ Place a towel between your skin and the bag.  ¨ Leave the ice on for 20 minutes, 2-3 times a day.  General instructions  · Take over-the-counter and prescription medicines only as told by your doctor.  ¨ Medicated creams and medicines that are inserted into the anus (suppositories) may be used or applied as told.  · Exercise often.  · Go to the bathroom when you have the urge to poop (to have a bowel movement). Do not wait.  · Avoid pushing too hard (straining) when you poop.  · Keep the butt area dry and clean. Use wet toilet paper or moist paper towels.  · Do not sit on the toilet for a long time.  Contact a doctor if:  · You have any of these:  ¨ Pain and swelling that do not get better with treatment or medicine.  ¨ Bleeding that will not stop.  ¨ Trouble pooping or you cannot poop.  ¨ Pain or swelling outside the area of the hemorrhoids.  This information is not intended to replace advice given to you by your health care provider. Make sure you discuss any questions you have with your health care provider.  Document Released: 02/22/2008 Document Revised: 10/21/2015 Document Reviewed: 01/27/2015  Elsevier  Interactive Patient Education © 2018 Elsevier Inc.   

## 2017-08-01 DIAGNOSIS — H9313 Tinnitus, bilateral: Secondary | ICD-10-CM | POA: Diagnosis not present

## 2017-12-05 ENCOUNTER — Encounter: Payer: Self-pay | Admitting: Certified Nurse Midwife

## 2017-12-05 ENCOUNTER — Other Ambulatory Visit: Payer: Self-pay

## 2017-12-05 ENCOUNTER — Ambulatory Visit (INDEPENDENT_AMBULATORY_CARE_PROVIDER_SITE_OTHER): Payer: 59 | Admitting: Certified Nurse Midwife

## 2017-12-05 VITALS — BP 112/64 | HR 76 | Resp 16 | Ht 66.0 in | Wt 151.0 lb

## 2017-12-05 DIAGNOSIS — Z87898 Personal history of other specified conditions: Secondary | ICD-10-CM

## 2017-12-05 DIAGNOSIS — Z0000 Encounter for general adult medical examination without abnormal findings: Secondary | ICD-10-CM | POA: Diagnosis not present

## 2017-12-05 DIAGNOSIS — Z01419 Encounter for gynecological examination (general) (routine) without abnormal findings: Secondary | ICD-10-CM | POA: Diagnosis not present

## 2017-12-05 DIAGNOSIS — E559 Vitamin D deficiency, unspecified: Secondary | ICD-10-CM | POA: Diagnosis not present

## 2017-12-05 DIAGNOSIS — R6889 Other general symptoms and signs: Secondary | ICD-10-CM | POA: Diagnosis not present
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
## 2017-12-05 NOTE — Patient Instructions (Signed)

## 2017-12-06 LAB — COMPREHENSIVE METABOLIC PANEL
ALK PHOS: 38 IU/L — AB (ref 39–117)
ALT: 20 IU/L (ref 0–32)
AST: 20 IU/L (ref 0–40)
Albumin/Globulin Ratio: 1.8 (ref 1.2–2.2)
Albumin: 4.4 g/dL (ref 3.5–5.5)
BILIRUBIN TOTAL: 0.3 mg/dL (ref 0.0–1.2)
BUN/Creatinine Ratio: 16 (ref 9–23)
BUN: 12 mg/dL (ref 6–24)
CHLORIDE: 102 mmol/L (ref 96–106)
CO2: 26 mmol/L (ref 20–29)
Calcium: 9.4 mg/dL (ref 8.7–10.2)
Creatinine, Ser: 0.75 mg/dL (ref 0.57–1.00)
GFR calc Af Amer: 102 mL/min/{1.73_m2} (ref >59)
GFR calc non Af Amer: 88 mL/min/{1.73_m2} (ref >59)
GLUCOSE: 67 mg/dL (ref 65–99)
Globulin, Total: 2.4 g/dL (ref 1.5–4.5)
Potassium: 4.5 mmol/L (ref 3.5–5.2)
Sodium: 140 mmol/L (ref 134–144)
Total Protein: 6.8 g/dL (ref 6.0–8.5)

## 2017-12-06 LAB — CBC
HEMOGLOBIN: 12.8 g/dL (ref 11.1–15.9)
Hematocrit: 39.2 % (ref 34.0–46.6)
MCH: 30 pg (ref 26.6–33.0)
MCHC: 32.7 g/dL (ref 31.5–35.7)
MCV: 92 fL (ref 79–97)
Platelets: 305 10*3/uL (ref 150–450)
RBC: 4.26 x10E6/uL (ref 3.77–5.28)
RDW: 13.7 % (ref 12.3–15.4)
WBC: 5.5 10*3/uL (ref 3.4–10.8)

## 2017-12-06 LAB — TSH: TSH: 1.35 u[IU]/mL (ref 0.450–4.500)

## 2017-12-06 LAB — LIPID PANEL
CHOL/HDL RATIO: 4 ratio (ref 0.0–4.4)
Cholesterol, Total: 203 mg/dL — ABNORMAL HIGH (ref 100–199)
HDL: 51 mg/dL (ref >39)
LDL CALC: 138 mg/dL — AB (ref 0–99)
Triglycerides: 71 mg/dL (ref 0–149)
VLDL CHOLESTEROL CAL: 14 mg/dL (ref 5–40)

## 2017-12-06 LAB — VITAMIN D 25 HYDROXY (VIT D DEFICIENCY, FRACTURES): VIT D 25 HYDROXY: 36 ng/mL (ref 30.0–100.0)

## 2018-01-31 DIAGNOSIS — K5730 Diverticulosis of large intestine without perforation or abscess without bleeding: Secondary | ICD-10-CM | POA: Diagnosis not present

## 2018-01-31 DIAGNOSIS — R1033 Periumbilical pain: Secondary | ICD-10-CM | POA: Diagnosis not present

## 2018-01-31 DIAGNOSIS — Z800 Family history of malignant neoplasm of digestive organs: Secondary | ICD-10-CM | POA: Diagnosis not present

## 2018-02-14 ENCOUNTER — Other Ambulatory Visit: Payer: Self-pay | Admitting: Gastroenterology

## 2018-02-14 DIAGNOSIS — R1013 Epigastric pain: Secondary | ICD-10-CM | POA: Diagnosis not present

## 2018-02-14 DIAGNOSIS — K5730 Diverticulosis of large intestine without perforation or abscess without bleeding: Secondary | ICD-10-CM | POA: Diagnosis not present

## 2018-02-14 DIAGNOSIS — Z86010 Personal history of colonic polyps: Secondary | ICD-10-CM | POA: Diagnosis not present

## 2018-02-19 ENCOUNTER — Ambulatory Visit (HOSPITAL_COMMUNITY): Payer: 59

## 2018-02-21 ENCOUNTER — Ambulatory Visit (HOSPITAL_COMMUNITY)
Admission: RE | Admit: 2018-02-21 | Discharge: 2018-02-21 | Disposition: A | Discharge status: Home / Self Care | Payer: 59 | Source: Ambulatory Visit | Attending: Gastroenterology | Admitting: Gastroenterology

## 2018-02-21 DIAGNOSIS — K7689 Other specified diseases of liver: Secondary | ICD-10-CM | POA: Diagnosis not present

## 2018-02-21 DIAGNOSIS — R1013 Epigastric pain: Secondary | ICD-10-CM | POA: Diagnosis present

## 2018-02-28 ENCOUNTER — Other Ambulatory Visit: Payer: Self-pay | Admitting: Gastroenterology

## 2018-02-28 DIAGNOSIS — R140 Abdominal distension (gaseous): Secondary | ICD-10-CM

## 2018-03-06 ENCOUNTER — Ambulatory Visit (HOSPITAL_COMMUNITY)
Admission: RE | Admit: 2018-03-06 | Discharge: 2018-03-06 | Disposition: A | Discharge status: Home / Self Care | Payer: 59 | Source: Ambulatory Visit | Attending: Gastroenterology | Admitting: Gastroenterology

## 2018-03-06 ENCOUNTER — Encounter (HOSPITAL_COMMUNITY): Payer: Self-pay

## 2018-03-06 DIAGNOSIS — R140 Abdominal distension (gaseous): Secondary | ICD-10-CM

## 2018-03-11 ENCOUNTER — Ambulatory Visit (HOSPITAL_COMMUNITY)
Admission: RE | Admit: 2018-03-11 | Discharge: 2018-03-11 | Disposition: A | Discharge status: Home / Self Care | Payer: 59 | Source: Ambulatory Visit | Attending: Gastroenterology | Admitting: Gastroenterology

## 2018-03-11 DIAGNOSIS — R140 Abdominal distension (gaseous): Secondary | ICD-10-CM | POA: Diagnosis not present

## 2018-03-12 ENCOUNTER — Telehealth: Payer: Self-pay | Admitting: Certified Nurse Midwife

## 2018-03-12 NOTE — Telephone Encounter (Signed)
Patient said " I had a PUS yesterday and was told to follow up with my GYN due to the result".

## 2018-03-12 NOTE — Telephone Encounter (Signed)
Please let the patient know that I reviewed her ultrasound images and report. Her endometrial stripe is 8 mm. In a postmenopausal woman with any bleeding (even spotting), an endometrial stripe over 4 mm warrants evaluation. Without bleeding or spotting, we typically don't evaluate women unless the endometrial stripe is 11 mm.  I'm happy to discuss all of this with her in person and go over her images with her if desired.

## 2018-03-12 NOTE — Telephone Encounter (Signed)
Call returned to patient. PUS completed at [facility] on 03/11/18, ordered by Dr. Loreta Ave. Patient was advised to f/u with GYN.   Advised Dr. Oscar La will review PUS, I will return call with recommendations, will schedule f/u at that time.  Patient verbalizes understanding and is agreeable.   Dr. Oscar La -please review PUS in Epic dated 03/11/18.    Cc: Leota Sauers, CNM

## 2018-03-13 NOTE — Telephone Encounter (Signed)
Left message to call Juleon Narang, RN at [facility] [phone].   

## 2018-03-13 NOTE — Telephone Encounter (Signed)
Spoke with patient. Advised as seen below per Dr. Oscar La. Patient denies any vaginal bleeding or spotting. Patient has additional questions regarding results and recommendations. Recommended OV with Dr. Oscar La for further discussion, patient agreeable. OV scheduled for 10/22 at 3:15pm with Dr. Oscar La.   Routing to provider for final review. Patient is agreeable to disposition. Will close encounter.
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
## 2018-03-19 ENCOUNTER — Other Ambulatory Visit: Payer: Self-pay

## 2018-03-19 ENCOUNTER — Ambulatory Visit: Payer: 59 | Admitting: Obstetrics and Gynecology

## 2018-03-19 ENCOUNTER — Encounter: Payer: Self-pay | Admitting: Obstetrics and Gynecology

## 2018-03-19 VITALS — BP 110/62 | HR 64 | Wt 151.4 lb

## 2018-03-19 DIAGNOSIS — R140 Abdominal distension (gaseous): Secondary | ICD-10-CM | POA: Diagnosis not present

## 2018-03-19 DIAGNOSIS — R9389 Abnormal findings on diagnostic imaging of other specified body structures: Secondary | ICD-10-CM | POA: Diagnosis not present

## 2018-05-09 ENCOUNTER — Encounter (HOSPITAL_COMMUNITY): Payer: Self-pay

## 2018-05-09 ENCOUNTER — Other Ambulatory Visit: Payer: Self-pay

## 2018-05-09 ENCOUNTER — Ambulatory Visit (HOSPITAL_COMMUNITY)
Admission: EM | Admit: 2018-05-09 | Discharge: 2018-05-09 | Disposition: A | Discharge status: Home / Self Care | Payer: 59 | Attending: Family Medicine | Admitting: Family Medicine

## 2018-05-09 DIAGNOSIS — Z87442 Personal history of urinary calculi: Secondary | ICD-10-CM | POA: Insufficient documentation

## 2018-05-09 DIAGNOSIS — J029 Acute pharyngitis, unspecified: Secondary | ICD-10-CM

## 2018-05-09 DIAGNOSIS — J028 Acute pharyngitis due to other specified organisms: Secondary | ICD-10-CM | POA: Diagnosis not present

## 2018-05-09 DIAGNOSIS — B9789 Other viral agents as the cause of diseases classified elsewhere: Secondary | ICD-10-CM | POA: Insufficient documentation

## 2018-05-09 DIAGNOSIS — K5730 Diverticulosis of large intestine without perforation or abscess without bleeding: Secondary | ICD-10-CM | POA: Diagnosis not present

## 2018-05-09 DIAGNOSIS — Z87891 Personal history of nicotine dependence: Secondary | ICD-10-CM | POA: Diagnosis not present

## 2018-05-09 LAB — POCT RAPID STREP A: STREPTOCOCCUS, GROUP A SCREEN (DIRECT): NEGATIVE

## 2018-05-09 NOTE — Discharge Instructions (Addendum)
Your rapid strep test was negative.  We will send it for culture. You can continue the ibuprofen as needed for pain and the warm salt water gargles You could also try daily antihistamine to see if I help with your symptoms Follow-up as needed for continued worsening symptoms

## 2018-05-09 NOTE — ED Notes (Signed)
Patient able to ambulate independently  

## 2018-05-09 NOTE — ED Triage Notes (Signed)
Pt has a sore throat x 4 days.
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
## 2018-05-10 ENCOUNTER — Ambulatory Visit: Payer: Self-pay | Admitting: Family Medicine

## 2018-05-11 ENCOUNTER — Ambulatory Visit (HOSPITAL_COMMUNITY)
Admission: EM | Admit: 2018-05-11 | Discharge: 2018-05-11 | Disposition: A | Discharge status: Home / Self Care | Payer: 59 | Attending: Family Medicine | Admitting: Family Medicine

## 2018-05-11 ENCOUNTER — Encounter (HOSPITAL_COMMUNITY): Payer: Self-pay | Admitting: Emergency Medicine

## 2018-05-11 DIAGNOSIS — J321 Chronic frontal sinusitis: Secondary | ICD-10-CM

## 2018-05-11 LAB — CULTURE, GROUP A STREP (THRC)

## 2018-05-11 MED ORDER — AMOXICILLIN-POT CLAVULANATE 875-125 MG PO TABS: 1.0000 | ORAL_TABLET | Freq: Two times a day (BID) | ORAL | 0 refills | Status: DC

## 2018-05-11 NOTE — Discharge Instructions (Signed)
We will go ahead and treat you for sinus infection Make sure you complete the full course of antibiotics You continue the ibuprofen as needed for pain inflammation Nasal saline irrigation as needed\Follow up as needed for continued or worsening symptoms
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
## 2018-05-11 NOTE — ED Triage Notes (Signed)
Pt here for URI sx with sinus congestion and chills

## 2018-12-11 ENCOUNTER — Encounter: Payer: Self-pay | Admitting: Certified Nurse Midwife

## 2018-12-11 ENCOUNTER — Other Ambulatory Visit: Payer: Self-pay

## 2018-12-11 ENCOUNTER — Ambulatory Visit: Payer: 59 | Admitting: Certified Nurse Midwife

## 2018-12-11 ENCOUNTER — Other Ambulatory Visit (HOSPITAL_COMMUNITY)
Admission: RE | Admit: 2018-12-11 | Discharge: 2018-12-11 | Disposition: A | Discharge status: Home / Self Care | Payer: 59 | Source: Ambulatory Visit | Attending: Certified Nurse Midwife | Admitting: Certified Nurse Midwife

## 2018-12-11 VITALS — BP 104/64 | HR 64 | Temp 97.2°F | Resp 16 | Ht 66.0 in | Wt 151.0 lb

## 2018-12-11 DIAGNOSIS — Z124 Encounter for screening for malignant neoplasm of cervix: Secondary | ICD-10-CM | POA: Insufficient documentation

## 2018-12-11 DIAGNOSIS — Z01419 Encounter for gynecological examination (general) (routine) without abnormal findings: Secondary | ICD-10-CM

## 2018-12-11 DIAGNOSIS — N951 Menopausal and female climacteric states: Secondary | ICD-10-CM
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
## 2018-12-17 LAB — CYTOLOGY - PAP
Diagnosis: NEGATIVE
HPV: NOT DETECTED

## 2019-06-17 ENCOUNTER — Telehealth (INDEPENDENT_AMBULATORY_CARE_PROVIDER_SITE_OTHER): Payer: 59 | Admitting: Family Medicine

## 2019-06-17 ENCOUNTER — Encounter: Payer: Self-pay | Admitting: Family Medicine

## 2019-06-17 ENCOUNTER — Other Ambulatory Visit: Payer: Self-pay

## 2019-06-17 VITALS — Wt 145.0 lb

## 2019-06-17 DIAGNOSIS — G4489 Other headache syndrome: Secondary | ICD-10-CM

## 2019-06-17 DIAGNOSIS — J019 Acute sinusitis, unspecified: Secondary | ICD-10-CM | POA: Diagnosis not present

## 2019-06-17 MED ORDER — AMOXICILLIN-POT CLAVULANATE 875-125 MG PO TABS: 1.0000 | ORAL_TABLET | Freq: Two times a day (BID) | ORAL | 0 refills | Status: DC

## 2019-06-17 MED ORDER — FLUTICASONE PROPIONATE 50 MCG/ACT NA SUSP: 2.0000 | Freq: Every day | NASAL | 6 refills | Status: DC

## 2019-06-17 NOTE — Progress Notes (Signed)
Patient ID: Katie Bond, female    DOB: [birth year]  Age: 59 y.o. MRN: [id number]  Chief Complaint  Patient presents with  . Headache, rn and congestion, ? sinus infection     Severe headache started on Sunday 06/15/2019 and rn/congestion started Sunday night.  Pt has hx of sinus infections.  Pt advises she has green/yellow mucus drainage.  No fever, nausea or vomiting, abdominal pain, or loss of sense of taste or smell. Pt is taking tylenol and ibuprofen for ha pain.  Per pt she pushed fluids on yesterday and rested.  No travel outside the [place] or [place] in the past 3 weeks.  No recent bp but weight is noted in vital tab.     Subjective:   Telemedicine visit:  Katie Bond was called for a telephone telemedicine visit.  She identified herself on the phone.  She is in a safe location for talking.  She understands the nature of telemedicine conferencing, that we lacked the ability to get vital signs or exam and might instruct her to go get checked if we feel like after talking with her.  Since Sunday she has had frontal headache, actually more in the temporal areas.  She is blowing purulent yellow-green mucus.  She has been checking her temperature and has not noted any change.  She has had no loss of sense or taste or smell.  She does not smoke.  She has had some sinusitis infections in the past and it is like them.  She has had a little sore throat right at the start.  No ear pain.  Only has coughed a few times when she coughs up some stuff.  No shortness of breath or chest pain.  No generalized malaise or body aches.  He is married.  She and her husband sleep in separate rooms due to his snoring, and he has not had any symptoms.  She scheduled herself for a Covid testing at the [facility] this afternoon.  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  Somebody had Covid a couple of weeks ago, but no other recent exposures.  She tries to be careful with the social  distancing, washing, and mask.  Current allergies, medications, problem list, past/family and social histories reviewed.  Objective:  Wt 145 lb (65.8 kg) Comment: per pt  LMP 05/30/2007   BMI 23.40 kg/m   Unable to examine  Assessment & Plan:   Assessment: 1. Acute rhinosinusitis   2. Other headache syndrome       Plan: Probably has an acute bacterial sinusitis from the description of things, but she understands it could be a viral illness such as the Covid which presents in so many different fashions.  She will go ahead and get her testing done as she plans.  Advised her not to go to work as she has a result of her test.  No orders of the defined types were placed in this encounter.   No orders of the defined types were placed in this encounter.        Patient Instructions   Drink plenty of fluids and get enough rest  Use the fluticasone nose spray 2 sprays each nostril twice daily for 3 or 4 days, then once daily  Take the Augmentin (amoxicillin/clavulanate) 1 twice daily.  Explained to her that there is a 5% or so cross allergenicity to the cephalosporins, so if she has any symptoms she should stop  it immediately and seek help.  If she gets worse in the Covid test is negative she might need to be examined, but if Covid test is positive she can just talk with [place].  Return as needed.    If you have lab work done today you will be contacted with your lab results within the next 2 weeks.  If you have not heard from [place] then please contact us. The fastest way to get your results is to register for My Chart.   IF you received an x-ray today, you will receive an invoice from [facility]. Please contact [facility] at [phone] with questions or concerns regarding your invoice.   IF you received labwork today, you will receive an invoice from [place]. Please contact [facility] at [phone] with questions or concerns regarding your invoice.   Our  billing staff will not be able to assist you with questions regarding bills from these companies.  You will be contacted with the lab results as soon as they are available. The fastest way to get your results is to activate your My Chart account. Instructions are located on the last page of this paperwork. If you have not heard from [place] regarding the results in 2 weeks, please contact this office.      Approximately 15 to 20 minutes time of care.  No follow-ups on file.   Ruben Reason, MD 06/17/2019

## 2019-06-17 NOTE — Patient Instructions (Addendum)
Drink plenty of fluids and get enough rest  Use the fluticasone nose spray 2 sprays each nostril twice daily for 3 or 4 days, then once daily  Take the Augmentin (amoxicillin/clavulanate) 1 twice daily.  Explained to her that there is a 5% or so cross allergenicity to the cephalosporins, so if she has any symptoms she should stop it immediately and seek help.  If she gets worse in the Covid test is negative she might need to be examined, but if Covid test is positive she can just talk with [place].  Return as needed.    If you have lab work done today you will be contacted with your lab results within the next 2 weeks.  If you have not heard from [place] then please contact us. The fastest way to get your results is to register for My Chart.   IF you received an x-ray today, you will receive an invoice from [facility]. Please contact [facility] at [phone] with questions or concerns regarding your invoice.   IF you received labwork today, you will receive an invoice from [facility]. Please contact [facility] at [phone] with questions or concerns regarding your invoice.   Our billing staff will not be able to assist you with questions regarding bills from these companies.  You will be contacted with the lab results as soon as they are available. The fastest way to get your results is to activate your My Chart account. Instructions are located on the last page of this paperwork. If you have not heard from [place] regarding the results in 2 weeks, please contact this office.

## 2019-06-17 NOTE — Progress Notes (Signed)
CC: Severe headache started on Sunday 06/15/2019 and rn/congestion started Sunday night.  Pt has hx of sinus infections.  Pt advises she has green/yellow mucus drainage.  No fever, nausea or vomiting, abdominal pain, or loss of sense of taste or smell. Pt is taking tylenol and ibuprofen for ha pain.  Per pt she pushed fluids on yesterday and rested.  No travel outside the [place] or [place] in the past 3 weeks.  No recent bp but weight is noted in vital tab.

## 2019-08-19 ENCOUNTER — Encounter: Payer: Self-pay | Admitting: Certified Nurse Midwife

## 2019-12-17 ENCOUNTER — Ambulatory Visit: Payer: 59 | Admitting: Certified Nurse Midwife

## 2020-01-02 ENCOUNTER — Emergency Department (HOSPITAL_COMMUNITY): Payer: 59

## 2020-01-02 ENCOUNTER — Other Ambulatory Visit: Payer: Self-pay

## 2020-01-02 ENCOUNTER — Emergency Department (HOSPITAL_COMMUNITY)
Admission: EM | Admit: 2020-01-02 | Discharge: 2020-01-02 | Disposition: A | Discharge status: Home / Self Care | Payer: 59 | Attending: Emergency Medicine | Admitting: Emergency Medicine

## 2020-01-02 ENCOUNTER — Encounter (HOSPITAL_COMMUNITY): Payer: Self-pay | Admitting: Emergency Medicine

## 2020-01-02 DIAGNOSIS — R531 Weakness: Secondary | ICD-10-CM | POA: Diagnosis present

## 2020-01-02 DIAGNOSIS — R519 Headache, unspecified: Secondary | ICD-10-CM | POA: Insufficient documentation

## 2020-01-02 DIAGNOSIS — M7918 Myalgia, other site: Secondary | ICD-10-CM | POA: Diagnosis not present

## 2020-01-02 DIAGNOSIS — U071 COVID-19: Secondary | ICD-10-CM | POA: Diagnosis not present

## 2020-01-02 DIAGNOSIS — Z87891 Personal history of nicotine dependence: Secondary | ICD-10-CM | POA: Diagnosis not present

## 2020-01-02 LAB — CBC WITH DIFFERENTIAL/PLATELET
Abs Immature Granulocytes: 0 10*3/uL (ref 0.00–0.07)
Basophils Absolute: 0 10*3/uL (ref 0.0–0.1)
Basophils Relative: 1 %
Eosinophils Absolute: 0 10*3/uL (ref 0.0–0.5)
Eosinophils Relative: 1 %
HCT: 39.3 % (ref 36.0–46.0)
Hemoglobin: 12.8 g/dL (ref 12.0–15.0)
Lymphocytes Relative: 23 %
Lymphs Abs: 0.8 10*3/uL (ref 0.7–4.0)
MCH: 30.5 pg (ref 26.0–34.0)
MCHC: 32.6 g/dL (ref 30.0–36.0)
MCV: 93.6 fL (ref 80.0–100.0)
Monocytes Absolute: 0 10*3/uL — ABNORMAL LOW (ref 0.1–1.0)
Monocytes Relative: 0 %
Neutro Abs: 2.6 10*3/uL (ref 1.7–7.7)
Neutrophils Relative %: 75 %
Platelets: 176 10*3/uL (ref 150–400)
RBC: 4.2 MIL/uL (ref 3.87–5.11)
RDW: 14 % (ref 11.5–15.5)
WBC: 3.5 10*3/uL — ABNORMAL LOW (ref 4.0–10.5)
nRBC: 0 % (ref 0.0–0.2)
nRBC: 0 /100 WBC

## 2020-01-02 LAB — COMPREHENSIVE METABOLIC PANEL
ALT: 32 U/L (ref 0–44)
AST: 35 U/L (ref 15–41)
Albumin: 3.4 g/dL — ABNORMAL LOW (ref 3.5–5.0)
Alkaline Phosphatase: 42 U/L (ref 38–126)
Anion gap: 10 (ref 5–15)
BUN: 5 mg/dL — ABNORMAL LOW (ref 6–20)
CO2: 23 mmol/L (ref 22–32)
Calcium: 8.7 mg/dL — ABNORMAL LOW (ref 8.9–10.3)
Chloride: 103 mmol/L (ref 98–111)
Creatinine, Ser: 0.84 mg/dL (ref 0.44–1.00)
GFR calc Af Amer: >60 mL/min (ref >60)
GFR calc non Af Amer: >60 mL/min (ref >60)
Glucose, Bld: 107 mg/dL — ABNORMAL HIGH (ref 70–99)
Potassium: 3.9 mmol/L (ref 3.5–5.1)
Sodium: 136 mmol/L (ref 135–145)
Total Bilirubin: 0.4 mg/dL (ref 0.3–1.2)
Total Protein: 6.7 g/dL (ref 6.5–8.1)

## 2020-01-02 LAB — LACTIC ACID, PLASMA: Lactic Acid, Venous: 0.8 mmol/L (ref 0.5–1.9)

## 2020-01-02 MED ORDER — PROCHLORPERAZINE EDISYLATE 10 MG/2ML IJ SOLN
10.0000 mg | Freq: Once | INTRAMUSCULAR | Status: AC
  Administered 2020-01-02: 10 mg via INTRAVENOUS
  Filled 2020-01-02: qty 2

## 2020-01-02 MED ORDER — METHYLPREDNISOLONE SODIUM SUCC 125 MG IJ SOLR: 125.0000 mg | Freq: Once | INTRAMUSCULAR | Status: DC

## 2020-01-02 MED ORDER — DIPHENHYDRAMINE HCL 50 MG/ML IJ SOLN
25.0000 mg | Freq: Once | INTRAMUSCULAR | Status: AC
  Administered 2020-01-02: 25 mg via INTRAVENOUS
  Filled 2020-01-02: qty 1

## 2020-01-02 NOTE — ED Triage Notes (Signed)
Pt reports diagnosed with covid last Friday, states that body aches and headache have gotten worse, had temp of 100. States she has been taking motrin and tylenol without relief. A/ox, resp e/u, nad.

## 2020-01-02 NOTE — ED Notes (Signed)
Patient verbalizes understanding of discharge instructions. Opportunity for questioning and answers were provided. Armband removed by staff, pt discharged from ED.  
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
## 2020-01-02 NOTE — Discharge Instructions (Signed)
Your headache is likely due to having the COVID-19 virus.  As discussed, please make sure to return to the ER if you have any worsening shortness of breath, or if your pulse oxygen percentage drops low 90% consistently.  Please make sure to continue to quarantine, and drink plenty of fluids.

## 2020-01-07 ENCOUNTER — Encounter (HOSPITAL_COMMUNITY): Payer: Self-pay | Admitting: Emergency Medicine

## 2020-01-07 ENCOUNTER — Ambulatory Visit (INDEPENDENT_AMBULATORY_CARE_PROVIDER_SITE_OTHER): Payer: 59

## 2020-01-07 ENCOUNTER — Other Ambulatory Visit: Payer: Self-pay

## 2020-01-07 ENCOUNTER — Ambulatory Visit: Payer: Self-pay | Admitting: *Deleted

## 2020-01-07 ENCOUNTER — Ambulatory Visit (HOSPITAL_COMMUNITY)
Admission: EM | Admit: 2020-01-07 | Discharge: 2020-01-07 | Disposition: A | Discharge status: Home / Self Care | Payer: 59 | Attending: Family Medicine | Admitting: Family Medicine

## 2020-01-07 DIAGNOSIS — U071 COVID-19: Secondary | ICD-10-CM

## 2020-01-07 DIAGNOSIS — R05 Cough: Secondary | ICD-10-CM

## 2020-01-07 DIAGNOSIS — J189 Pneumonia, unspecified organism: Secondary | ICD-10-CM | POA: Diagnosis not present

## 2020-01-07 MED ORDER — PREDNISONE 10 MG PO TABS: 20.0000 mg | ORAL_TABLET | Freq: Every day | ORAL | 0 refills | Status: DC

## 2020-01-07 MED ORDER — AZITHROMYCIN 250 MG PO TABS: ORAL_TABLET | ORAL | 0 refills | Status: DC

## 2020-01-07 NOTE — Telephone Encounter (Signed)
Patient has a virtual appt with Dr Neva Seat 01/08/2020. She was Dx with Covid 19 virus about 2 weeks ago and on 01/02/2020 was seen at [facility] ED for headache, fatigue/weakness and fever. Phone message today from patient states fever has returned with continued fatigue and weakness. Patient was advised to go to [facility] UC today, I checked the chart and patient did go to [facility]. Also, patient was advised by nurse to J. Renae Fickle to cancel appointment tomorrow if she feels it is not necessary.
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
## 2020-01-07 NOTE — Discharge Instructions (Addendum)
Call infusion [phone]

## 2020-01-07 NOTE — Telephone Encounter (Addendum)
Patient called to speak to a nurse about her covid symptoms and the way she have been feeling for the past 2 weeks. Please call Ph# [phone] or [phone]  Patient is calling to report she had COVID diagnosis 7/30. Patient reports she did go to ED for headache recently and had chest Xray/labs. Patient states she is very fatigued and her fever has returned. Patient is presently treating with Motrin and Tylenol as needed. She has Nyquil cough syrup that she is using for cough- but states she does not need it that much. Patient's main concern is the weakness she feels. Call to office- no appointment until tomorrow afternoon. Advised patient to go to UC/ED today- she can cancel appointment tomorrow if she feels it is not necessary. Reason for Disposition . [1] Fever > 101 F (38.3 C) AND [2] age > 60 years  Answer Assessment - Initial Assessment Questions 1. COVID-19 DIAGNOSIS: "Who made your Coronavirus (COVID-19) diagnosis?" "Was it confirmed by a positive lab test?" If not diagnosed by a HCP, ask "Are there lots of cases (community spread) where you live?" (See public health department website, if unsure)     7/30- lab test 2. COVID-19 EXPOSURE: "Was there any known exposure to COVID before the symptoms began?" CDC Definition of close contact: within 6 feet (2 meters) for a total of 15 minutes or more over a 24-hour period.      Unknown 3. ONSET: "When did the COVID-19 symptoms start?"      7/30 4. WORST SYMPTOM: "What is your worst symptom?" (e.g., cough, fever, shortness of breath, muscle aches)     weakness 5. COUGH: "Do you have a cough?" If Yes, ask: "How bad is the cough?"       Yes- spasms when lays down 6. FEVER: "Do you have a fever?" If Yes, ask: "What is your temperature, how was it measured, and when did it start?"     101.6- tylenol 7. RESPIRATORY STATUS: "Describe your breathing?" (e.g., shortness of breath, wheezing, unable to speak)      Patient states she is doing fine with  breathing- O2 sat 92 8. BETTER-SAME-WORSE: "Are you getting better, staying the same or getting worse compared to yesterday?"  If getting worse, ask, "In what way?"     Better- little bit better 9. HIGH RISK DISEASE: "Do you have any chronic medical problems?" (e.g., asthma, heart or lung disease, weak immune system, obesity, etc.)     No- age 59. PREGNANCY: "Is there any chance you are pregnant?" "When was your last menstrual period?"       n/a 11. OTHER SYMPTOMS: "Do you have any other symptoms?"  (e.g., chills, fatigue, headache, loss of smell or taste, muscle pain, sore throat; new loss of smell or taste especially support the diagnosis of COVID-19)       Fatigue, headcahe  Protocols used: CORONAVIRUS (COVID-19) DIAGNOSED OR SUSPECTED-A-AH

## 2020-01-07 NOTE — ED Triage Notes (Signed)
Pt presents to [facility] for assessment of continued cough since she was diagnosed with COVID on 7/30.  Patient states she had a fever of 101.6 today.  Patient states she took 3 motrin at 1230.  States she called the RN triage line and was told to come to the [facility] for a follow-up chest xray to r/o pneumonia.

## 2020-01-08 ENCOUNTER — Telehealth (HOSPITAL_COMMUNITY): Payer: Self-pay | Admitting: Emergency Medicine

## 2020-01-08 ENCOUNTER — Other Ambulatory Visit: Payer: Self-pay

## 2020-01-08 ENCOUNTER — Telehealth (INDEPENDENT_AMBULATORY_CARE_PROVIDER_SITE_OTHER): Payer: 59 | Admitting: Family Medicine

## 2020-01-08 ENCOUNTER — Encounter: Payer: Self-pay | Admitting: Nurse Practitioner

## 2020-01-08 DIAGNOSIS — R5383 Other fatigue: Secondary | ICD-10-CM

## 2020-01-08 NOTE — Progress Notes (Signed)
Pt does not qualify for infusion therapy as her symptoms first presented > 10 days prior to timing of infusion.    Patient Active Problem List   Diagnosis Date Noted  . Community acquired pneumonia 01/07/2020  . Diverticulosis of colon without hemorrhage 12/09/2016  . Health care maintenance 08/31/2011  . History of recurrent UTI (urinary tract infection) 08/31/2011    Willette Alma, AGPCNP-BC

## 2020-01-08 NOTE — Telephone Encounter (Signed)
Verified patient's identity with 2 identifiers.  Patient called reporting she was told to call iv clinic, unable to get a return call.  Dr Delton See reviewed medical record.  Patient is not a candidate for infusion.  covid is > 10 days ago.  Notified patient .  Patient states understanding. Patient asked how soon she could get covid vaccine.  Spoke to providers-3 months is the soonest she can get a covid vaccine.

## 2020-01-11 NOTE — Progress Notes (Signed)
Pt rescheduled

## 2020-01-12 ENCOUNTER — Encounter: Payer: Self-pay | Admitting: Family Medicine

## 2020-01-12 ENCOUNTER — Other Ambulatory Visit: Payer: Self-pay

## 2020-01-12 ENCOUNTER — Telehealth (INDEPENDENT_AMBULATORY_CARE_PROVIDER_SITE_OTHER): Payer: 59 | Admitting: Family Medicine

## 2020-01-12 VITALS — Ht 66.0 in | Wt 140.0 lb

## 2020-01-12 DIAGNOSIS — R05 Cough: Secondary | ICD-10-CM

## 2020-01-12 DIAGNOSIS — U071 COVID-19: Secondary | ICD-10-CM | POA: Diagnosis not present

## 2020-01-12 DIAGNOSIS — R059 Cough, unspecified: Secondary | ICD-10-CM

## 2020-01-12 DIAGNOSIS — J1282 Pneumonia due to coronavirus disease 2019: Secondary | ICD-10-CM | POA: Diagnosis not present

## 2020-01-12 NOTE — Patient Instructions (Addendum)
Based on your illness, return to work after 20 days is ok. That would be this Friday.  Continue prednisone until finished. If any worsening of symptoms, be seen in Urgent Care or ER.   Plan for vaccine no later than October 1st to make sure you are vaccinated. You can also discuss vaccine timing with health department as you may be able to receive that sooner.   Glad to hear you are feeling better. Take care!  If you have lab work done today you will be contacted with your lab results within the next 2 weeks.  If you have not heard from [place] then please contact us. The fastest way to get your results is to register for My Chart.   IF you received an x-ray today, you will receive an invoice from [facility]. Please contact [facility] at [phone] with questions or concerns regarding your invoice.   IF you received labwork today, you will receive an invoice from [facility]. Please contact [facility] at [phone] with questions or concerns regarding your invoice.   Our billing staff will not be able to assist you with questions regarding bills from these companies.  You will be contacted with the lab results as soon as they are available. The fastest way to get your results is to activate your My Chart account. Instructions are located on the last page of this paperwork. If you have not heard from [place] regarding the results in 2 weeks, please contact this office.
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
## 2020-01-13 ENCOUNTER — Encounter: Payer: Self-pay | Admitting: Family Medicine

## 2021-03-24 ENCOUNTER — Encounter: Payer: Self-pay | Admitting: Obstetrics and Gynecology

## 2021-03-24 ENCOUNTER — Ambulatory Visit: Payer: 59 | Admitting: Obstetrics and Gynecology

## 2021-03-24 ENCOUNTER — Other Ambulatory Visit: Payer: Self-pay

## 2021-03-24 VITALS — HR 87 | Ht 66.0 in | Wt 142.0 lb

## 2021-03-24 DIAGNOSIS — R350 Frequency of micturition: Secondary | ICD-10-CM | POA: Diagnosis not present

## 2021-03-24 DIAGNOSIS — M549 Dorsalgia, unspecified: Secondary | ICD-10-CM | POA: Diagnosis not present
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
## 2021-03-24 NOTE — Patient Instructions (Signed)

## 2021-03-26 LAB — URINALYSIS, COMPLETE W/RFL CULTURE
Bilirubin Urine: NEGATIVE
Glucose, UA: NEGATIVE
Hgb urine dipstick: NEGATIVE
Hyaline Cast: NONE SEEN /LPF
Ketones, ur: NEGATIVE
Nitrites, Initial: NEGATIVE
Protein, ur: NEGATIVE
RBC / HPF: NONE SEEN /HPF (ref 0–2)
Specific Gravity, Urine: 1.015 (ref 1.001–1.035)
pH: 7.5 (ref 5.0–8.0)

## 2021-03-26 LAB — URINE CULTURE
MICRO NUMBER:: 12560051
SPECIMEN QUALITY:: ADEQUATE

## 2021-03-26 LAB — CULTURE INDICATED

## 2021-05-11 ENCOUNTER — Other Ambulatory Visit: Payer: Self-pay

## 2021-05-11 ENCOUNTER — Ambulatory Visit
Admission: RE | Admit: 2021-05-11 | Discharge: 2021-05-11 | Disposition: A | Discharge status: Home / Self Care | Payer: 59 | Source: Ambulatory Visit

## 2021-05-11 VITALS — BP 117/71 | HR 64 | Temp 99.0°F | Resp 16

## 2021-05-11 DIAGNOSIS — J0141 Acute recurrent pansinusitis: Secondary | ICD-10-CM

## 2021-05-11 DIAGNOSIS — B9689 Other specified bacterial agents as the cause of diseases classified elsewhere: Secondary | ICD-10-CM | POA: Diagnosis not present

## 2021-05-11 DIAGNOSIS — J019 Acute sinusitis, unspecified: Secondary | ICD-10-CM | POA: Diagnosis not present

## 2021-05-11 DIAGNOSIS — Z20822 Contact with and (suspected) exposure to covid-19: Secondary | ICD-10-CM

## 2021-05-11 DIAGNOSIS — J329 Chronic sinusitis, unspecified: Secondary | ICD-10-CM

## 2021-05-11 DIAGNOSIS — J3489 Other specified disorders of nose and nasal sinuses: Secondary | ICD-10-CM

## 2021-05-11 DIAGNOSIS — Z9889 Other specified postprocedural states: Secondary | ICD-10-CM

## 2021-05-11 MED ORDER — GUAIFENESIN 400 MG PO TABS: ORAL_TABLET | ORAL | 0 refills | Status: DC

## 2021-05-11 MED ORDER — DOXYCYCLINE HYCLATE 100 MG PO CAPS: 100.0000 mg | ORAL_CAPSULE | Freq: Two times a day (BID) | ORAL | 0 refills | Status: AC

## 2021-05-11 MED ORDER — IPRATROPIUM BROMIDE 0.06 % NA SOLN: 2.0000 | Freq: Four times a day (QID) | NASAL | 0 refills | Status: DC

## 2021-05-11 MED ORDER — IBUPROFEN 200 MG PO TABS: 200.0000 mg | ORAL_TABLET | Freq: Four times a day (QID) | ORAL | 0 refills | Status: AC

## 2021-05-11 NOTE — Discharge Instructions (Addendum)
For acute recurrent pansinusitis and rhinosinusitis that has likely evolved from a viral infection into a bacterial infection, I recommend that you begin a full dose of doxycycline which is one tablet twice daily for 30 full days, if you find that you have significant improvement after 14 days, I recommend that you complete a full 21-day course and discontinue.  Continuing doxycycline for 7 days after you have relief of your symptoms we will ensure that the most resistant bacteria present are completely eradicated.    The most common side effect of this medication is photosensitivity, meaning if you are out in the sun you may tan or burn more easily.  It is also important to be sure that you do not take this medication within 2 hours of consuming any dairy products, calcium binds with doxycycline and makes it ineffective.  I would also like for you to continue taking Mucinex 1 tablet 3 times daily for the next several days to improve drainage and thin secretions.  Ibuprofen will also be of benefit, 200 mg 3-4 times daily.  I have provided you with a prescription for Atrovent nasal spray, spray 2 sprays into each nare up to 4 times daily to dry up mucous membranes and decrease inflammation.  Please feel free to follow-up with your primary care provider or with urgent care if you have not had complete resolution of your symptoms in the next 14 days.
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
## 2021-05-11 NOTE — ED Triage Notes (Signed)
Pt states she might have a sinus infection, she c/o productive cough and sore throat. She states some of her co-workers have had Covid recently. Started: Sunday

## 2021-05-13 LAB — COVID-19, FLU A+B NAA
Influenza A, NAA: NOT DETECTED
Influenza B, NAA: NOT DETECTED
SARS-CoV-2, NAA: NOT DETECTED
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
## 2021-06-17 ENCOUNTER — Other Ambulatory Visit: Payer: Self-pay

## 2021-06-17 ENCOUNTER — Ambulatory Visit (INDEPENDENT_AMBULATORY_CARE_PROVIDER_SITE_OTHER): Payer: 59 | Admitting: Obstetrics and Gynecology

## 2021-06-17 VITALS — BP 120/64 | HR 62 | Ht 66.0 in | Wt 148.0 lb

## 2021-06-17 DIAGNOSIS — R140 Abdominal distension (gaseous): Secondary | ICD-10-CM | POA: Insufficient documentation

## 2021-06-17 DIAGNOSIS — Z0000 Encounter for general adult medical examination without abnormal findings: Secondary | ICD-10-CM | POA: Diagnosis not present

## 2021-06-17 DIAGNOSIS — Z23 Encounter for immunization: Secondary | ICD-10-CM

## 2021-06-17 DIAGNOSIS — N952 Postmenopausal atrophic vaginitis: Secondary | ICD-10-CM

## 2021-06-17 DIAGNOSIS — Z8719 Personal history of other diseases of the digestive system: Secondary | ICD-10-CM

## 2021-06-17 DIAGNOSIS — Z01419 Encounter for gynecological examination (general) (routine) without abnormal findings: Secondary | ICD-10-CM

## 2021-06-17 DIAGNOSIS — E7800 Pure hypercholesterolemia, unspecified: Secondary | ICD-10-CM

## 2021-06-17 DIAGNOSIS — E559 Vitamin D deficiency, unspecified: Secondary | ICD-10-CM

## 2021-06-17 DIAGNOSIS — Z800 Family history of malignant neoplasm of digestive organs: Secondary | ICD-10-CM | POA: Insufficient documentation

## 2021-06-17 DIAGNOSIS — Z86010 Personal history of colonic polyps: Secondary | ICD-10-CM | POA: Insufficient documentation

## 2021-06-17 DIAGNOSIS — R631 Polydipsia: Secondary | ICD-10-CM

## 2021-06-17 DIAGNOSIS — L659 Nonscarring hair loss, unspecified: Secondary | ICD-10-CM

## 2021-06-17 NOTE — Patient Instructions (Signed)

## 2021-06-18 LAB — COMPREHENSIVE METABOLIC PANEL
AG Ratio: 1.9 (calc) (ref 1.0–2.5)
ALT: 26 U/L (ref 6–29)
AST: 25 U/L (ref 10–35)
Albumin: 4.7 g/dL (ref 3.6–5.1)
Alkaline phosphatase (APISO): 46 U/L (ref 37–153)
BUN: 14 mg/dL (ref 7–25)
CO2: 28 mmol/L (ref 20–32)
Calcium: 9.7 mg/dL (ref 8.6–10.4)
Chloride: 107 mmol/L (ref 98–110)
Creat: 0.79 mg/dL (ref 0.50–1.05)
Globulin: 2.5 g/dL (calc) (ref 1.9–3.7)
Glucose, Bld: 82 mg/dL (ref 65–99)
Potassium: 4.2 mmol/L (ref 3.5–5.3)
Sodium: 142 mmol/L (ref 135–146)
Total Bilirubin: 0.3 mg/dL (ref 0.2–1.2)
Total Protein: 7.2 g/dL (ref 6.1–8.1)

## 2021-06-18 LAB — LIPID PANEL
Cholesterol: 234 mg/dL — ABNORMAL HIGH (ref <200)
HDL: 60 mg/dL (ref >=50)
LDL Cholesterol (Calc): 149 mg/dL (calc) — ABNORMAL HIGH
Non-HDL Cholesterol (Calc): 174 mg/dL (calc) — ABNORMAL HIGH (ref <130)
Total CHOL/HDL Ratio: 3.9 (calc) (ref <5.0)
Triglycerides: 127 mg/dL (ref <150)

## 2021-06-18 LAB — CBC
HCT: 41 % (ref 35.0–45.0)
Hemoglobin: 13.8 g/dL (ref 11.7–15.5)
MCH: 31.2 pg (ref 27.0–33.0)
MCHC: 33.7 g/dL (ref 32.0–36.0)
MCV: 92.6 fL (ref 80.0–100.0)
MPV: 9.7 fL (ref 7.5–12.5)
Platelets: 306 10*3/uL (ref 140–400)
RBC: 4.43 10*6/uL (ref 3.80–5.10)
RDW: 12.7 % (ref 11.0–15.0)
WBC: 7.5 10*3/uL (ref 3.8–10.8)

## 2021-06-18 LAB — HEMOGLOBIN A1C
Hgb A1c MFr Bld: 5.2 % of total Hgb (ref <5.7)
Mean Plasma Glucose: 103 mg/dL
eAG (mmol/L): 5.7 mmol/L

## 2021-06-18 LAB — VITAMIN D 25 HYDROXY (VIT D DEFICIENCY, FRACTURES): Vit D, 25-Hydroxy: 38 ng/mL (ref 30–100)

## 2021-06-18 LAB — TSH: TSH: 1.96 mIU/L (ref 0.40–4.50)

## 2021-06-20 ENCOUNTER — Telehealth: Payer: Self-pay

## 2021-06-20 NOTE — Telephone Encounter (Signed)
I called patient with result note info.  She said at her visit you noted last Colonoscopy was 2016 but she was sure she had one sooner. She spoke with Dr. Kenna Gilbert office and she had one in 2021. She will ask them to send you a copy of the report.

## 2021-10-25 ENCOUNTER — Ambulatory Visit: Payer: 59 | Admitting: Emergency Medicine

## 2021-10-25 ENCOUNTER — Encounter: Payer: Self-pay | Admitting: Emergency Medicine

## 2021-10-25 VITALS — BP 110/72 | HR 66 | Temp 98.4°F | Ht 66.0 in | Wt 148.0 lb

## 2021-10-25 DIAGNOSIS — Z7689 Persons encountering health services in other specified circumstances: Secondary | ICD-10-CM

## 2021-10-25 DIAGNOSIS — K644 Residual hemorrhoidal skin tags: Secondary | ICD-10-CM | POA: Diagnosis not present

## 2021-10-25 DIAGNOSIS — L989 Disorder of the skin and subcutaneous tissue, unspecified: Secondary | ICD-10-CM | POA: Diagnosis not present

## 2021-10-25 DIAGNOSIS — Z0000 Encounter for general adult medical examination without abnormal findings: Secondary | ICD-10-CM

## 2021-10-25 DIAGNOSIS — Z86010 Personal history of colonic polyps: Secondary | ICD-10-CM

## 2021-10-25 DIAGNOSIS — Z1231 Encounter for screening mammogram for malignant neoplasm of breast: Secondary | ICD-10-CM

## 2021-10-25 DIAGNOSIS — K5730 Diverticulosis of large intestine without perforation or abscess without bleeding: Secondary | ICD-10-CM

## 2021-10-25 NOTE — Patient Instructions (Signed)
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
## 2021-10-29 ENCOUNTER — Other Ambulatory Visit: Payer: Self-pay | Admitting: Emergency Medicine

## 2021-10-29 DIAGNOSIS — Z1231 Encounter for screening mammogram for malignant neoplasm of breast: Secondary | ICD-10-CM

## 2021-11-08 ENCOUNTER — Ambulatory Visit
Admission: RE | Admit: 2021-11-08 | Discharge: 2021-11-08 | Disposition: A | Discharge status: Home / Self Care | Payer: 59 | Source: Ambulatory Visit | Attending: Emergency Medicine | Admitting: Emergency Medicine

## 2021-11-08 DIAGNOSIS — Z1231 Encounter for screening mammogram for malignant neoplasm of breast: Secondary | ICD-10-CM

## 2022-03-30 ENCOUNTER — Other Ambulatory Visit: Payer: Self-pay

## 2022-03-30 ENCOUNTER — Encounter: Payer: Self-pay | Admitting: Emergency Medicine

## 2022-03-30 ENCOUNTER — Ambulatory Visit
Admission: EM | Admit: 2022-03-30 | Discharge: 2022-03-30 | Disposition: A | Discharge status: Home / Self Care | Payer: 59 | Attending: Physician Assistant | Admitting: Physician Assistant

## 2022-03-30 DIAGNOSIS — H8110 Benign paroxysmal vertigo, unspecified ear: Secondary | ICD-10-CM

## 2022-03-30 DIAGNOSIS — H9202 Otalgia, left ear: Secondary | ICD-10-CM | POA: Diagnosis not present

## 2022-03-30 MED ORDER — AMOXICILLIN 500 MG PO CAPS
500.0000 mg | ORAL_CAPSULE | Freq: Three times a day (TID) | ORAL | 0 refills | Status: DC
Start: 2022-03-30 — End: 2022-08-02

## 2022-03-30 MED ORDER — MECLIZINE HCL 25 MG PO TABS: 25.0000 mg | ORAL_TABLET | Freq: Three times a day (TID) | ORAL | 0 refills | Status: DC | PRN
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
## 2022-03-30 NOTE — ED Triage Notes (Signed)
Pt here for dizziness worse with position change starting this am; pt sts some left ear issues 2 days ago

## 2022-04-23 ENCOUNTER — Ambulatory Visit
Admission: RE | Admit: 2022-04-23 | Discharge: 2022-04-23 | Disposition: A | Discharge status: Home / Self Care | Payer: 59 | Source: Ambulatory Visit | Attending: Urgent Care | Admitting: Urgent Care

## 2022-04-23 VITALS — BP 147/81 | HR 60 | Temp 98.8°F | Resp 20

## 2022-04-23 DIAGNOSIS — R070 Pain in throat: Secondary | ICD-10-CM

## 2022-04-23 DIAGNOSIS — J019 Acute sinusitis, unspecified: Secondary | ICD-10-CM

## 2022-04-23 DIAGNOSIS — R0982 Postnasal drip: Secondary | ICD-10-CM

## 2022-04-23 MED ORDER — PREDNISONE 10 MG PO TABS: 20.0000 mg | ORAL_TABLET | Freq: Every day | ORAL | 0 refills | Status: DC

## 2022-04-23 MED ORDER — CETIRIZINE HCL 10 MG PO TABS
10.0000 mg | ORAL_TABLET | Freq: Every day | ORAL | 0 refills | Status: DC
Start: 2022-04-23 — End: 2022-08-02

## 2022-04-23 MED ORDER — PSEUDOEPHEDRINE HCL 30 MG PO TABS: 30.0000 mg | ORAL_TABLET | Freq: Three times a day (TID) | ORAL | 0 refills | Status: DC | PRN

## 2022-04-23 NOTE — Discharge Instructions (Addendum)
Please starting using cetirizine and prednisone today. In 5 days, you will finish the prednisone and then you can start using pseudoephedrine if you need it. I would have you maintain cetirizine the rest of the month.

## 2022-04-23 NOTE — ED Triage Notes (Signed)
Pt reports she has yellow mucus, coughing it up blowing it out her nose since Friday, she took motrin tylenol but no relief. Drank a lot of fluids but nothing is helping. Has a headache and upper teeth pain.
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
## 2022-06-14 ENCOUNTER — Ambulatory Visit: Payer: 59 | Admitting: Emergency Medicine

## 2022-06-14 ENCOUNTER — Encounter: Payer: Self-pay | Admitting: Emergency Medicine

## 2022-06-14 VITALS — BP 104/68 | HR 64 | Temp 98.1°F | Ht 66.0 in | Wt 150.5 lb

## 2022-06-14 DIAGNOSIS — K644 Residual hemorrhoidal skin tags: Secondary | ICD-10-CM | POA: Diagnosis not present

## 2022-06-14 MED ORDER — HYDROCORTISONE (PERIANAL) 2.5 % EX CREA: 1.0000 | TOPICAL_CREAM | Freq: Two times a day (BID) | CUTANEOUS | 2 refills | Status: DC
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
## 2022-06-14 NOTE — Assessment & Plan Note (Signed)
Uncomplicated. Recommend to use Anusol HC cream as needed Needs referral to colorectal surgeon. Referral placed today.

## 2022-06-14 NOTE — Patient Instructions (Signed)
Hemorrhoids Hemorrhoids are swollen veins that may develop: In the butt (rectum). These are called internal hemorrhoids. Around the opening of the butt (anus). These are called external hemorrhoids. Hemorrhoids can cause pain, itching, or bleeding. Most of the time, they do not cause serious problems. They usually get better with diet changes, lifestyle changes, and other home treatments. What are the causes? This condition may be caused by: Having trouble pooping (constipation). Pushing hard (straining) to poop. Watery poop (diarrhea). Pregnancy. Being very overweight (obese). Sitting for long periods of time. Heavy lifting or other activity that causes you to strain. Anal sex. Riding a bike for a long period of time. What are the signs or symptoms? Symptoms of this condition include: Pain. Itching or soreness in the butt. Bleeding from the butt. Leaking poop. Swelling in the area. One or more lumps around the opening of your butt. How is this diagnosed? A doctor can often diagnose this condition by looking at the affected area. The doctor may also: Do an exam that involves feeling the area with a gloved hand (digital rectal exam). Examine the area inside your butt using a small tube (anoscope). Order blood tests. This may be done if you have lost a lot of blood. Have you get a test that involves looking inside the colon using a flexible tube with a camera on the end (sigmoidoscopy or colonoscopy). How is this treated? This condition can usually be treated at home. Your doctor may tell you to change what you eat, make lifestyle changes, or try home treatments. If these do not help, procedures can be done to remove the hemorrhoids or make them smaller. These may involve: Placing rubber bands at the base of the hemorrhoids to cut off their blood supply. Injecting medicine into the hemorrhoids to shrink them. Shining a type of light energy onto the hemorrhoids to cause them to fall  off. Doing surgery to remove the hemorrhoids or cut off their blood supply. Follow these instructions at home: Eating and drinking  Eat foods that have a lot of fiber in them. These include whole grains, beans, nuts, fruits, and vegetables. Ask your doctor about taking products that have added fiber (fibersupplements). Reduce the amount of fat in your diet. You can do this by: Eating low-fat dairy products. Eating less red meat. Avoiding processed foods. Drink enough fluid to keep your pee (urine) pale yellow. Managing pain and swelling  Take a warm-water bath (sitz bath) for 20 minutes to ease pain. Do this 3-4 times a day. You may do this in a bathtub or using a portable sitz bath that fits over the toilet. If told, put ice on the painful area. It may be helpful to use ice between your warm baths. Put ice in a plastic bag. Place a towel between your skin and the bag. Leave the ice on for 20 minutes, 2-3 times a day. General instructions Take over-the-counter and prescription medicines only as told by your doctor. Medicated creams and medicines may be used as told. Exercise often. Ask your doctor how much and what kind of exercise is best for you. Go to the bathroom when you have the urge to poop. Do not wait. Avoid pushing too hard when you poop. Keep your butt dry and clean. Use wet toilet paper or moist towelettes after pooping. Do not sit on the toilet for a long time. Keep all follow-up visits as told by your doctor. This is important. Contact a doctor if you: Have pain and  swelling that do not get better with treatment or medicine. Have trouble pooping. Cannot poop. Have pain or swelling outside the area of the hemorrhoids. Get help right away if you have: Bleeding that will not stop. Summary Hemorrhoids are swollen veins in the butt or around the opening of the butt. They can cause pain, itching, or bleeding. Eat foods that have a lot of fiber in them. These include  whole grains, beans, nuts, fruits, and vegetables. Take a warm-water bath (sitz bath) for 20 minutes to ease pain. Do this 3-4 times a day. This information is not intended to replace advice given to you by your health care provider. Make sure you discuss any questions you have with your health care provider. Document Revised: 11/23/2020 Document Reviewed: 11/24/2020 Elsevier Patient Education  2023 Elsevier Inc.  

## 2022-06-20 DIAGNOSIS — K625 Hemorrhage of anus and rectum: Secondary | ICD-10-CM | POA: Insufficient documentation

## 2022-06-20 DIAGNOSIS — Z86010 Personal history of colonic polyps: Secondary | ICD-10-CM | POA: Insufficient documentation

## 2022-06-20 DIAGNOSIS — K641 Second degree hemorrhoids: Secondary | ICD-10-CM | POA: Insufficient documentation

## 2022-06-20 DIAGNOSIS — K5730 Diverticulosis of large intestine without perforation or abscess without bleeding: Secondary | ICD-10-CM | POA: Insufficient documentation

## 2022-06-20 DIAGNOSIS — K642 Third degree hemorrhoids: Secondary | ICD-10-CM | POA: Insufficient documentation
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
## 2022-08-02 ENCOUNTER — Encounter: Payer: Self-pay | Admitting: Obstetrics and Gynecology

## 2022-08-02 ENCOUNTER — Ambulatory Visit (INDEPENDENT_AMBULATORY_CARE_PROVIDER_SITE_OTHER): Payer: 59 | Admitting: Obstetrics and Gynecology

## 2022-08-02 VITALS — BP 124/72 | HR 67 | Ht 65.5 in | Wt 151.0 lb

## 2022-08-02 DIAGNOSIS — Z01419 Encounter for gynecological examination (general) (routine) without abnormal findings: Secondary | ICD-10-CM

## 2022-08-02 DIAGNOSIS — E559 Vitamin D deficiency, unspecified: Secondary | ICD-10-CM

## 2022-08-02 DIAGNOSIS — R5383 Other fatigue: Secondary | ICD-10-CM | POA: Diagnosis not present

## 2022-08-02 DIAGNOSIS — Z0000 Encounter for general adult medical examination without abnormal findings: Secondary | ICD-10-CM

## 2022-08-02 DIAGNOSIS — E7800 Pure hypercholesterolemia, unspecified: Secondary | ICD-10-CM

## 2022-08-11 ENCOUNTER — Other Ambulatory Visit: Payer: 59

## 2022-08-12 LAB — LIPID PANEL
Cholesterol: 210 mg/dL — ABNORMAL HIGH (ref <200)
HDL: 56 mg/dL (ref >=50)
LDL Cholesterol (Calc): 136 mg/dL (calc) — ABNORMAL HIGH
Non-HDL Cholesterol (Calc): 154 mg/dL (calc) — ABNORMAL HIGH (ref <130)
Total CHOL/HDL Ratio: 3.8 (calc) (ref <5.0)
Triglycerides: 81 mg/dL (ref <150)

## 2022-08-12 LAB — VITAMIN D 25 HYDROXY (VIT D DEFICIENCY, FRACTURES): Vit D, 25-Hydroxy: 33 ng/mL (ref 30–100)

## 2022-08-12 LAB — CBC
HCT: 43.5 % (ref 35.0–45.0)
Hemoglobin: 14.2 g/dL (ref 11.7–15.5)
MCH: 29.6 pg (ref 27.0–33.0)
MCHC: 32.6 g/dL (ref 32.0–36.0)
MCV: 90.6 fL (ref 80.0–100.0)
MPV: 9.6 fL (ref 7.5–12.5)
Platelets: 318 10*3/uL (ref 140–400)
RBC: 4.8 10*6/uL (ref 3.80–5.10)
RDW: 12.9 % (ref 11.0–15.0)
WBC: 6.1 10*3/uL (ref 3.8–10.8)

## 2022-08-12 LAB — COMPREHENSIVE METABOLIC PANEL
AG Ratio: 1.6 (calc) (ref 1.0–2.5)
ALT: 26 U/L (ref 6–29)
AST: 25 U/L (ref 10–35)
Albumin: 4.6 g/dL (ref 3.6–5.1)
Alkaline phosphatase (APISO): 54 U/L (ref 37–153)
BUN: 15 mg/dL (ref 7–25)
CO2: 26 mmol/L (ref 20–32)
Calcium: 10.1 mg/dL (ref 8.6–10.4)
Chloride: 105 mmol/L (ref 98–110)
Creat: 0.75 mg/dL (ref 0.50–1.05)
Globulin: 2.8 g/dL (calc) (ref 1.9–3.7)
Glucose, Bld: 92 mg/dL (ref 65–99)
Potassium: 4.7 mmol/L (ref 3.5–5.3)
Sodium: 142 mmol/L (ref 135–146)
Total Bilirubin: 0.4 mg/dL (ref 0.2–1.2)
Total Protein: 7.4 g/dL (ref 6.1–8.1)

## 2023-02-06 ENCOUNTER — Other Ambulatory Visit: Payer: Self-pay

## 2023-02-06 ENCOUNTER — Encounter (HOSPITAL_COMMUNITY): Payer: Self-pay | Admitting: Emergency Medicine

## 2023-02-06 ENCOUNTER — Emergency Department (HOSPITAL_COMMUNITY): Payer: 59

## 2023-02-06 ENCOUNTER — Emergency Department (HOSPITAL_COMMUNITY)
Admission: EM | Admit: 2023-02-06 | Discharge: 2023-02-06 | Disposition: A | Discharge status: Home / Self Care | Payer: 59 | Attending: Emergency Medicine | Admitting: Emergency Medicine

## 2023-02-06 ENCOUNTER — Ambulatory Visit
Admission: EM | Admit: 2023-02-06 | Discharge: 2023-02-06 | Disposition: A | Discharge status: Other Acute Inpatient Hospital | Payer: 59 | Attending: Physician Assistant | Admitting: Physician Assistant

## 2023-02-06 DIAGNOSIS — S46911A Strain of unspecified muscle, fascia and tendon at shoulder and upper arm level, right arm, initial encounter: Secondary | ICD-10-CM | POA: Insufficient documentation

## 2023-02-06 DIAGNOSIS — M79601 Pain in right arm: Secondary | ICD-10-CM

## 2023-02-06 DIAGNOSIS — T148XXA Other injury of unspecified body region, initial encounter: Secondary | ICD-10-CM

## 2023-02-06 DIAGNOSIS — X58XXXA Exposure to other specified factors, initial encounter: Secondary | ICD-10-CM | POA: Insufficient documentation

## 2023-02-06 LAB — TROPONIN I (HIGH SENSITIVITY)
Troponin I (High Sensitivity): 3 ng/L (ref <18)
Troponin I (High Sensitivity): 4 ng/L (ref <18)

## 2023-02-06 LAB — CBC WITH DIFFERENTIAL/PLATELET
Abs Immature Granulocytes: 0.01 10*3/uL (ref 0.00–0.07)
Basophils Absolute: 0 10*3/uL (ref 0.0–0.1)
Basophils Relative: 0 %
Eosinophils Absolute: 0 10*3/uL (ref 0.0–0.5)
Eosinophils Relative: 1 %
HCT: 39.8 % (ref 36.0–46.0)
Hemoglobin: 13 g/dL (ref 12.0–15.0)
Immature Granulocytes: 0 %
Lymphocytes Relative: 26 %
Lymphs Abs: 1.6 10*3/uL (ref 0.7–4.0)
MCH: 30 pg (ref 26.0–34.0)
MCHC: 32.7 g/dL (ref 30.0–36.0)
MCV: 91.7 fL (ref 80.0–100.0)
Monocytes Absolute: 0.3 10*3/uL (ref 0.1–1.0)
Monocytes Relative: 6 %
Neutro Abs: 4.1 10*3/uL (ref 1.7–7.7)
Neutrophils Relative %: 67 %
Platelets: 302 10*3/uL (ref 150–400)
RBC: 4.34 MIL/uL (ref 3.87–5.11)
RDW: 14.1 % (ref 11.5–15.5)
WBC: 6.1 10*3/uL (ref 4.0–10.5)
nRBC: 0 % (ref 0.0–0.2)

## 2023-02-06 LAB — BASIC METABOLIC PANEL
Anion gap: 8 (ref 5–15)
BUN: 7 mg/dL — ABNORMAL LOW (ref 8–23)
CO2: 20 mmol/L — ABNORMAL LOW (ref 22–32)
Calcium: 8.8 mg/dL — ABNORMAL LOW (ref 8.9–10.3)
Chloride: 111 mmol/L (ref 98–111)
Creatinine, Ser: 0.68 mg/dL (ref 0.44–1.00)
GFR, Estimated: >60 mL/min (ref >60)
Glucose, Bld: 95 mg/dL (ref 70–99)
Potassium: 3.6 mmol/L (ref 3.5–5.1)
Sodium: 139 mmol/L (ref 135–145)

## 2023-02-06 LAB — MAGNESIUM: Magnesium: 1.9 mg/dL (ref 1.7–2.4)

## 2023-02-06 MED ORDER — ETODOLAC 400 MG PO TABS: 400.0000 mg | ORAL_TABLET | Freq: Two times a day (BID) | ORAL | 0 refills | Status: DC

## 2023-02-06 MED ORDER — KETOROLAC TROMETHAMINE 15 MG/ML IJ SOLN
15.0000 mg | Freq: Once | INTRAMUSCULAR | Status: AC
  Administered 2023-02-06: 15 mg via INTRAVENOUS
  Filled 2023-02-06: qty 1

## 2023-02-06 MED ORDER — LIDOCAINE 5 % EX PTCH
1.0000 | MEDICATED_PATCH | CUTANEOUS | Status: DC
  Administered 2023-02-06: 1 via TRANSDERMAL
  Filled 2023-02-06: qty 1

## 2023-02-06 MED ORDER — DIAZEPAM 2 MG PO TABS
2.0000 mg | ORAL_TABLET | Freq: Once | ORAL | Status: AC
  Administered 2023-02-06: 2 mg via ORAL
  Filled 2023-02-06: qty 1

## 2023-02-06 MED ORDER — METHOCARBAMOL 500 MG PO TABS: 500.0000 mg | ORAL_TABLET | Freq: Two times a day (BID) | ORAL | 0 refills | Status: DC
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
## 2023-02-06 NOTE — Discharge Instructions (Signed)
Your workup today was reassuring.  Likely musculoskeletal in nature.  Likely muscle strain.  I have sent in a muscle relaxer and anti-inflammatory medication into the pharmacy for you.  Muscle relaxer called Robaxin will make you drowsy.  Do not do anything dangerous after taking the medication.  Lodine is an anti-inflammatory medication.  Do not combine this with other anti-inflammatory such as Advil or Aleve.  Follow-up with your primary care provider.  For any concerning symptoms return to the emergency room.

## 2023-02-06 NOTE — ED Triage Notes (Signed)
"  My right side, arm and shoulder hurts bad". "I did a lot of cleaning Sunday". When I got up Monday "I was a little dizzy and with nausea". Pain is "unbearable". "I have to hold my right arm up for it not to hurt". No chest pain. No sob.

## 2023-02-06 NOTE — ED Notes (Signed)
Patient is being discharged from the Urgent Care and sent to the Emergency Department via POV . Per RM, patient is in need of higher level of care due to arm pain. Patient is aware and verbalizes understanding of plan of care.  Vitals:   02/06/23 0817 02/06/23 0821  BP: (!) 144/72 (!) 155/76  Pulse: 75   Resp: 20   Temp: 97.8 F (36.6 C)   SpO2: 99%

## 2023-02-06 NOTE — ED Provider Notes (Signed)
[place] EMERGENCY DEPARTMENT AT [facility] Provider Note   CSN: [id number] Arrival date & time: 02/06/23  4098     History  Chief Complaint  Patient presents with   Arm Pain    Katie Bond is a 62 y.o. female.  62 year old female presents today for concern of right arm and shoulder pain that started Sunday.  She states Sunday she was doing house chores and noticed the pain started after.  And worsened on Monday.  Monday she also noticed some nausea without vomiting, as well as dizziness.  Nausea and dizziness have since resolved.  No chest pain, or shortness of breath.  Pain is worse with certain range of motion.  She has not tried anything for this prior to arrival.  No history of CAD.  No significant cardiac history within her family.  She was seen at urgent care and referred to the emergency room for further evaluation.  She denies any chest pain, shortness of breath.  The history is provided by the patient. No language interpreter was used.       Home Medications Prior to Admission medications   Medication Sig Start Date End Date Taking? Authorizing Provider  hydrocortisone (ANUSOL-HC) 2.5 % rectal cream Place 1 Application rectally 2 (two) times daily. Patient not taking: Reported on 08/02/2022 06/14/22   Georgina Quint, MD      Allergies    Cephalexin, Ciprofloxacin, Codeine, and Septra [sulfamethoxazole-trimethoprim]    Review of Systems   Review of Systems  Constitutional:  Negative for chills and fever.  Eyes:  Negative for visual disturbance.  Respiratory:  Negative for shortness of breath.   Cardiovascular:  Negative for chest pain.  Gastrointestinal:  Negative for nausea.  Musculoskeletal:  Positive for arthralgias.  Neurological:  Negative for light-headedness.  All other systems reviewed and are negative.   Physical Exam Updated Vital Signs BP 132/80   Pulse 62   Temp 98.2 F (36.8 C) (Oral)   Resp 20   Ht 5\' 7"  (1.702 m)   Wt  66.2 kg   LMP 05/30/2007   SpO2 100%   BMI 22.87 kg/m  Physical Exam Vitals and nursing note reviewed.  Constitutional:      General: She is not in acute distress.    Appearance: Normal appearance. She is not ill-appearing.  HENT:     Head: Normocephalic and atraumatic.     Nose: Nose normal.  Eyes:     General: No scleral icterus.    Extraocular Movements: Extraocular movements intact.     Conjunctiva/sclera: Conjunctivae normal.  Cardiovascular:     Rate and Rhythm: Normal rate and regular rhythm.     Heart sounds: Normal heart sounds.  Pulmonary:     Effort: Pulmonary effort is normal. No respiratory distress.     Breath sounds: Normal breath sounds. No wheezing or rales.  Musculoskeletal:        General: Normal range of motion.     Cervical back: Normal range of motion.  Skin:    General: Skin is warm and dry.  Neurological:     General: No focal deficit present.     Mental Status: She is alert. Mental status is at baseline.     ED Results / Procedures / Treatments   Labs (all labs ordered are listed, but only abnormal results are displayed) Labs Reviewed  BASIC METABOLIC PANEL - Abnormal; Notable for the following components:      Result Value   CO2 20 (*)  BUN 7 (*)    Calcium 8.8 (*)    All other components within normal limits  CBC WITH DIFFERENTIAL/PLATELET  MAGNESIUM  TROPONIN I (HIGH SENSITIVITY)  TROPONIN I (HIGH SENSITIVITY)    EKG EKG Interpretation Date/Time:  Tuesday February 06 2023 09:47:19 EDT Ventricular Rate:  61 PR Interval:  156 QRS Duration:  107 QT Interval:  423 QTC Calculation: 427 R Axis:   52  Text Interpretation: Sinus rhythm No significant change since last tracing Confirmed by Linwood Dibbles [id number]) on 02/06/2023 9:48:54 AM  Radiology No results found.  Procedures Procedures    Medications Ordered in ED Medications  lidocaine (LIDODERM) 5 % 1 patch (1 patch Transdermal Patch Applied 02/06/23 1011)  ketorolac  (TORADOL) 15 MG/ML injection 15 mg (15 mg Intravenous Given 02/06/23 1010)  diazepam (VALIUM) tablet 2 mg (2 mg Oral Given 02/06/23 1011)    ED Course/ Medical Decision Making/ A&P                                 Medical Decision Making Amount and/or Complexity of Data Reviewed Labs: ordered. Radiology: ordered.  Risk Prescription drug management.   62 year old female presents today for evaluation of right arm and shoulder pain that started Sunday.  She had some associated symptoms yesterday including nausea and dizziness.  Those symptoms have resolved.  She was seen at urgent care and sent to the emergency room for evaluation particularly to rule out ACS.  Will provide multimodal pain control as well as obtain ACS workup.  CBC unremarkable.  BMP without acute concerns.  Troponin negative x 2.  Magnesium 1.9.  Chest x-ray without acute cardiopulmonary process.  EKG without acute ischemic changes.  Low heart score.  Low suspicion for ACS.  Symptoms improve after pain control in the emergency department.  Will prescribe Lodine and Robaxin.  Patient is appropriate for discharge.  Discharged in stable condition.  Return precautions discussed.   Final Clinical Impression(s) / ED Diagnoses Final diagnoses:  Muscle strain    Rx / DC Orders ED Discharge Orders          Ordered    methocarbamol (ROBAXIN) 500 MG tablet  2 times daily        02/06/23 1257    etodolac (LODINE) 400 MG tablet  2 times daily        09 /10/24 1257              Marita Kansas, PA-C 02/06/23 1305    Linwood Dibbles, MD 02/06/23 1616

## 2023-02-06 NOTE — ED Triage Notes (Signed)
Patient arrives ambulatory by POV c/o right arm, shoulder and neck pain onset of Monday morning. pain feels better when arm is elevated. Pain noticed after doing household chores. Went to UC and sent here for further eval

## 2023-02-07 ENCOUNTER — Encounter: Payer: Self-pay | Admitting: Internal Medicine

## 2023-02-07 ENCOUNTER — Ambulatory Visit: Payer: 59 | Admitting: Internal Medicine

## 2023-02-07 VITALS — BP 136/80 | HR 67 | Temp 98.2°F | Ht 67.0 in | Wt 147.8 lb

## 2023-02-07 DIAGNOSIS — M25511 Pain in right shoulder: Secondary | ICD-10-CM | POA: Diagnosis not present
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
## 2023-02-07 NOTE — Patient Instructions (Addendum)
[facility] urgent care:  [address] [place], [place], [address] [phone]  Go in to parking deck. Take elevator to 2nd floor, [address]

## 2023-02-14 ENCOUNTER — Emergency Department (HOSPITAL_COMMUNITY)
Admission: EM | Admit: 2023-02-14 | Discharge: 2023-02-14 | Disposition: A | Discharge status: Home / Self Care | Payer: 59 | Attending: Student | Admitting: Student

## 2023-02-14 ENCOUNTER — Emergency Department (HOSPITAL_COMMUNITY)
Admission: EM | Admit: 2023-02-14 | Discharge: 2023-02-14 | Discharge status: Against Medical Advice | Payer: 59 | Attending: Emergency Medicine | Admitting: Emergency Medicine

## 2023-02-14 ENCOUNTER — Encounter (HOSPITAL_COMMUNITY): Payer: Self-pay | Admitting: Emergency Medicine

## 2023-02-14 ENCOUNTER — Other Ambulatory Visit: Payer: Self-pay

## 2023-02-14 DIAGNOSIS — M542 Cervicalgia: Secondary | ICD-10-CM | POA: Insufficient documentation

## 2023-02-14 DIAGNOSIS — M25511 Pain in right shoulder: Secondary | ICD-10-CM | POA: Insufficient documentation

## 2023-02-14 DIAGNOSIS — Z5321 Procedure and treatment not carried out due to patient leaving prior to being seen by health care provider: Secondary | ICD-10-CM | POA: Diagnosis not present

## 2023-02-14 MED ORDER — DIAZEPAM 5 MG PO TABS
5.0000 mg | ORAL_TABLET | Freq: Once | ORAL | Status: AC
  Administered 2023-02-14: 5 mg via ORAL
  Filled 2023-02-14: qty 1

## 2023-02-14 MED ORDER — OXYCODONE HCL 5 MG PO TABS
5.0000 mg | ORAL_TABLET | Freq: Once | ORAL | Status: AC
  Administered 2023-02-14: 5 mg via ORAL
  Filled 2023-02-14: qty 1

## 2023-02-14 MED ORDER — KETOROLAC TROMETHAMINE 15 MG/ML IJ SOLN
15.0000 mg | Freq: Once | INTRAMUSCULAR | Status: AC
  Administered 2023-02-14: 15 mg via INTRAMUSCULAR
  Filled 2023-02-14: qty 1

## 2023-02-14 MED ORDER — ONDANSETRON 4 MG PO TBDP
4.0000 mg | ORAL_TABLET | Freq: Once | ORAL | Status: AC
  Administered 2023-02-14: 4 mg via ORAL
  Filled 2023-02-14: qty 1

## 2023-02-14 MED ORDER — HYDROCODONE-ACETAMINOPHEN 5-325 MG PO TABS
1.0000 | ORAL_TABLET | ORAL | 0 refills | Status: AC | PRN
Start: 2023-02-14 — End: 2023-02-17

## 2023-02-14 MED ORDER — ACETAMINOPHEN 500 MG PO TABS
1000.0000 mg | ORAL_TABLET | Freq: Once | ORAL | Status: AC
  Administered 2023-02-14: 1000 mg via ORAL
  Filled 2023-02-14: qty 2

## 2023-02-14 NOTE — ED Notes (Signed)
Pt went AMA

## 2023-02-14 NOTE — ED Triage Notes (Signed)
Pt c/o right shoulder pain and posterior neck pain x 1 week. Was seen at [facility] and diagnosed with a pinched nerve

## 2023-02-14 NOTE — ED Provider Notes (Signed)
[place] EM[facility]ENC[facility] DEPARTMENT AT [facility] Provider Note   CSN: [id number] Arrival date & time: 02/14/23  4098     History  Chief Complaint  Patient presents with   Shoulder Pain    Katie Bond is a 62 y.o. female.  62 y.o female with a PmH of Migraines, Depression presents to the ED with a chief complaint of right shoulder pain x 1 week.  Patient's spouse at the bedside reports patient has had approximately 11 visits to different facilities including urgent care, primary care, chiropractor, specialist without any resolution in her pain.  She describes the pain as sharp to her right shoulder extending into her right neck along with right scapula exacerbated with any movement.  This pain began after she was lifting a heavy water case up the stairs at home.  She has been taking Flexeril, steroids, NSAIDs without any improvement in her symptoms.  She did have a prior cardiac workup which was negative.  Also had a right shoulder x-ray that did not show any acute findings.  She reports she was told she would likely need an MRI to rule out a pinched nerve, but has not had this scheduled yet. No chest pain, no headache, no shortness of breath or vision changes.   The history is provided by the patient and the spouse.  Shoulder Pain Associated symptoms: no fever        Home Medications Prior to Admission medications   Medication Sig Start Date End Date Taking? Authorizing Provider  HYDROcodone-acetaminophen (NORCO/VICODIN) 5-325 MG tablet Take 1 tablet by mouth every 4 (four) hours as needed for up to 3 days. 02/14/23 02/17/23 Yes Ammon Muscatello, Leonie Douglas, PA-C  etodolac (LODINE) 400 MG tablet Take 1 tablet (400 mg total) by mouth 2 (two) times daily. 02/06/23   Marita Kansas, PA-C  hydrocortisone (ANUSOL-HC) 2.5 % rectal cream Place 1 Application rectally 2 (two) times daily. 06/14/22   Georgina Quint, MD  methocarbamol (ROBAXIN) 500 MG tablet Take 1 tablet (500 mg total) by mouth 2  (two) times daily. 02/06/23   Marita Kansas, PA-C      Allergies    Cephalexin, Ciprofloxacin, Codeine, and Septra [sulfamethoxazole-trimethoprim]    Review of Systems   Review of Systems  Constitutional:  Negative for fever.  Respiratory:  Negative for shortness of breath.   Cardiovascular:  Negative for chest pain.  Gastrointestinal:  Negative for abdominal pain.  Musculoskeletal:  Positive for arthralgias.    Physical Exam Updated Vital Signs BP 134/82   Pulse 82   Temp 98.1 F (36.7 C)   Resp 16   LMP 05/30/2007   SpO2 100%  Physical Exam Vitals and nursing note reviewed.  Constitutional:      Appearance: Normal appearance.  HENT:     Head: Normocephalic and atraumatic.     Mouth/Throat:     Mouth: Mucous membranes are moist.  Cardiovascular:     Rate and Rhythm: Normal rate.     Pulses:          Radial pulses are 2+ on the right side and 2+ on the left side.  Pulmonary:     Effort: Pulmonary effort is normal.  Abdominal:     General: Abdomen is flat.  Musculoskeletal:        General: Tenderness present.     Right shoulder: Tenderness present. No bony tenderness. Normal range of motion.     Cervical back: Normal range of motion and neck supple.  Comments: Tenderness with ROM, worsen with overhead.   Skin:    General: Skin is warm and dry.  Neurological:     Mental Status: She is alert and oriented to person, place, and time.     ED Results / Procedures / Treatments   Labs (all labs ordered are listed, but only abnormal results are displayed) Labs Reviewed - No data to display  EKG None  Radiology No results found.  Procedures Procedures    Medications Ordered in ED Medications  diazepam (VALIUM) tablet 5 mg (5 mg Oral Given 02/14/23 0855)  acetaminophen (TYLENOL) tablet 1,000 mg (1,000 mg Oral Given 02/14/23 0855)  oxyCODONE (Oxy IR/ROXICODONE) immediate release tablet 5 mg (5 mg Oral Given 02/14/23 0855)  ketorolac (TORADOL) 15 MG/ML injection  15 mg (15 mg Intramuscular Given 02/14/23 0907)  ondansetron (ZOFRAN-ODT) disintegrating tablet 4 mg (4 mg Oral Given 02/14/23 0912)    ED Course/ Medical Decision Making/ A&P                                 Medical Decision Making Risk OTC drugs. Prescription drug management.   Patient presents to the ED with a chief complaint of right shoulder pain that is been ongoing for several weeks.  Evaluated multiple times according to spouse this is the [facility] visit to Medical Center.  She is complaining of right shoulder pain, previous cardiac workup was negative.  She is here today as the muscle relaxers do not help her pain.  She says her specialist is concerned for a pinched nerve and she requires an MRI which we cannot provide at this time today with a normal neurological exam.  She was given medication for pain control here such as Roxie, Valium, Toradol, Tylenol along with Zofran as she reports she did not eat today.  She has a good exam with good range of motion although there is tenderness with palpation along with overhead reach.  I discussed with her adding Norco for pain control, will ultimately need to specialist referral, she just finished steroids as well therefore unable to repeat this course.  She is agreeable to following up with specialist.  This time with worsening pain with range of ocean along with pain starting after lifting a heavy jug of water of her stairs.  Of note, her right shoulder x-ray, neck x-ray have been without any acute process.   Portions of this note were generated with Scientist, clinical (histocompatibility and immunogenetics). Dictation errors may occur despite best attempts at proofreading.   Final Clinical Impression(s) / ED Diagnoses Final diagnoses:  Acute pain of right shoulder    Rx / DC Orders ED Discharge Orders          Ordered    HYDROcodone-acetaminophen (NORCO/VICODIN) 5-325 MG tablet  Every 4 hours PRN        02/14/23 0923              Claude Manges, PA-C 02/14/23  0941    Glendora Score, MD 02/14/23 2057

## 2023-02-14 NOTE — Discharge Instructions (Addendum)
Please follow up with your specialist for your ongoing right shoulder pain.    I have prescribed medication to help with pain control, please take this as directed.

## 2023-02-14 NOTE — ED Triage Notes (Signed)
Patient reports persistent pan at posterior neck radiating to right shoulder onset last Monday unrelieved by prescription medications.

## 2023-02-19 ENCOUNTER — Telehealth: Payer: Self-pay | Admitting: Emergency Medicine

## 2023-02-19 NOTE — Telephone Encounter (Signed)
Surgical clearance received, placed in provider office for signature

## 2023-02-19 NOTE — Telephone Encounter (Signed)
Patient dropped off document Surgical Clearance, to be filled out by provider. Patient requested to send it back via Fax [phone] within ASAP. Document is located in providers tray at front office.Please advise at Mobile [phone] (mobile) , pt would like a call when form has been faxed.

## 2023-02-20 NOTE — Telephone Encounter (Signed)
Patient has to come in for a surgical clearance appt. Appt scheduled for 9/30

## 2023-02-26 ENCOUNTER — Encounter: Payer: Self-pay | Admitting: Emergency Medicine

## 2023-02-26 ENCOUNTER — Ambulatory Visit: Payer: 59 | Admitting: Emergency Medicine

## 2023-02-26 VITALS — BP 114/68 | HR 83 | Temp 98.2°F | Ht 67.0 in | Wt 145.4 lb

## 2023-02-26 DIAGNOSIS — Z01818 Encounter for other preprocedural examination: Secondary | ICD-10-CM | POA: Diagnosis not present

## 2023-02-26 NOTE — Patient Instructions (Signed)

## 2023-02-26 NOTE — Progress Notes (Signed)
Subjective:    Katie Bond is a 62 y.o. female who presents to the office today for a preoperative consultation at the request of surgeon Venita Lick, MD who plans on performing cervical fusion on October 4. Planned anesthesia: general. The patient has the following known anesthesia issues:  None . Patients bleeding risk: no recent abnormal bleeding. Patient does not have objections to receiving blood products if needed.  The following portions of the patient's history were reviewed and updated as appropriate: allergies, current medications, past family history, past medical history, past social history, past surgical history, and problem list.  Review of Systems A comprehensive review of systems was negative.    Objective:    BP 114/68   Pulse 83   Temp 98.2 F (36.8 C) (Oral)   Ht 5\' 7"  (1.702 m)   Wt 145 lb 6 oz (65.9 kg)   LMP 05/30/2007   SpO2 98%   BMI 22.77 kg/m   General Appearance:    Alert, cooperative, no distress, appears stated age  Head:    Normocephalic, without obvious abnormality, atraumatic  Eyes:    PERRL, conjunctiva/corneas clear, EOM's intact, fundi    benign, both eyes  Ears:    Normal TM's and external ear canals, both ears  Nose:   Nares normal, septum midline, mucosa normal, no drainage    or sinus tenderness  Throat:   Lips, mucosa, and tongue normal; teeth and gums normal  Neck:   Supple, symmetrical, trachea midline, no adenopathy;    thyroid:  no enlargement/tenderness/nodules; no carotid   bruit or JVD  Back:     Symmetric, no curvature, ROM normal, no CVA tenderness  Lungs:     Clear to auscultation bilaterally, respirations unlabored  Chest Wall:    No tenderness or deformity   Heart:    Regular rate and rhythm, S1 and S2 normal, no murmur, rub   or gallop  Breast Exam:    No tenderness, masses, or nipple abnormality  Abdomen:     Soft, non-tender, bowel sounds active all four quadrants,    no masses, no organomegaly  Genitalia:    Normal  female without lesion, discharge or tenderness  Rectal:    Normal tone, normal prostate, no masses or tenderness;   guaiac negative stool  Extremities:   Extremities normal, atraumatic, no cyanosis or edema  Pulses:   2+ and symmetric all extremities  Skin:   Skin color, texture, turgor normal, no rashes or lesions  Lymph nodes:   Cervical, supraclavicular, and axillary nodes normal  Neurologic:   CNII-XII intact, normal strength, sensation and reflexes    throughout    Predictors of intubation difficulty:  Morbid obesity? no  Anatomically abnormal facies? no  Prominent incisors? no  Receding mandible? no  Short, thick neck? no  Neck range of motion: abnormal   Cardiographics ECG: normal sinus rhythm, no blocks or conduction defects, no ischemic changes Echocardiogram: not done  Imaging Chest x-ray: normal   Lab Review  Admission on 02/06/2023, Discharged on 02/06/2023  Component Date Value   Sodium 02/06/2023 139    Potassium 02/06/2023 3.6    Chloride 02/06/2023 111    CO2 02/06/2023 20 (L)    Glucose, Bld 02/06/2023 95    BUN 02/06/2023 7 (L)    Creatinine, Ser 02/06/2023 0.68    Calcium 02/06/2023 8.8 (L)    GFR, Estimated 02/06/2023 >60    Anion gap 02/06/2023 8    WBC 02/06/2023 6.1  RBC 02/06/2023 4.34    Hemoglobin 02/06/2023 13.0    HCT 02/06/2023 39.8    MCV 02/06/2023 91.7    MCH 02/06/2023 30.0    MCHC 02/06/2023 32.7    RDW 02/06/2023 14.1    Platelets 02/06/2023 302    nRBC 02/06/2023 0.0    Neutrophils Relative % 02/06/2023 67    Neutro Abs 02/06/2023 4.1    Lymphocytes Relative 02/06/2023 26    Lymphs Abs 02/06/2023 1.6    Monocytes Relative 02/06/2023 6    Monocytes Absolute 02/06/2023 0.3    Eosinophils Relative 02/06/2023 1    Eosinophils Absolute 02/06/2023 0.0    Basophils Relative 02/06/2023 0    Basophils Absolute 02/06/2023 0.0    Immature Granulocytes 02/06/2023 0    Abs Immature Granulocytes 02/06/2023 0.01    Troponin I (High  Sensiti* 02/06/2023 3    Magnesium 02/06/2023 1.9    Troponin I (High Sensiti* 02/06/2023 4       Assessment:    A total of 45 minutes was spent with the patient and counseling/coordination of care regarding preparing for this visit, review of most recent office visit notes, review of most recent blood work results, review of most recent EKG tracing, review of most recent chest x-ray report, comprehensive history and physical examination, preop cardiac risk stratification, prognosis, documentation and need for follow-up.  Problem List Items Addressed This Visit   None Visit Diagnoses     Preoperative examination    -  Primary        62 y.o. female with planned surgery as above.   Known risk factors for perioperative complications: None   Difficulty with intubation is not anticipated.  Cardiac Risk Estimation: Low risk  Current medications which may produce withdrawal symptoms if withheld perioperatively: None    Plan:    1. Preoperative workup as follows chest x-ray, ECG, hemoglobin, hematocrit, electrolytes, creatinine, glucose. 2. Change in medication regimen before surgery: none, continue medication regimen including morning of surgery, with sip of water. 3. Prophylaxis for cardiac events with perioperative beta-blockers: not indicated. 4. Invasive hemodynamic monitoring perioperatively: at the discretion of anesthesiologist. 5. Deep vein thrombosis prophylaxis postoperatively:regimen to be chosen by surgical team. 6. Surveillance for postoperative MI with ECG immediately postoperatively and on postoperative days 1 and 2 AND troponin levels 24 hours postoperatively and on day 4 or hospital discharge (whichever comes first): at the discretion of anesthesiologist. 7. Other measures:  None

## 2023-08-06 ENCOUNTER — Ambulatory Visit (INDEPENDENT_AMBULATORY_CARE_PROVIDER_SITE_OTHER): Payer: 59 | Admitting: Emergency Medicine

## 2023-08-06 ENCOUNTER — Encounter: Payer: Self-pay | Admitting: Emergency Medicine

## 2023-08-06 VITALS — BP 110/64 | HR 62 | Temp 98.0°F | Ht 67.0 in | Wt 151.1 lb

## 2023-08-06 DIAGNOSIS — Z13228 Encounter for screening for other metabolic disorders: Secondary | ICD-10-CM

## 2023-08-06 DIAGNOSIS — Z1329 Encounter for screening for other suspected endocrine disorder: Secondary | ICD-10-CM | POA: Diagnosis not present

## 2023-08-06 DIAGNOSIS — Z13220 Encounter for screening for lipoid disorders: Secondary | ICD-10-CM | POA: Diagnosis not present

## 2023-08-06 DIAGNOSIS — Z0000 Encounter for general adult medical examination without abnormal findings: Secondary | ICD-10-CM | POA: Diagnosis not present

## 2023-08-06 DIAGNOSIS — Z130 Encounter for screening for diseases of the blood and blood-forming organs and certain disorders involving the immune mechanism: Secondary | ICD-10-CM | POA: Diagnosis not present

## 2023-08-06 LAB — COMPREHENSIVE METABOLIC PANEL
ALT: 22 U/L (ref 0–35)
AST: 22 U/L (ref 0–37)
Albumin: 4.5 g/dL (ref 3.5–5.2)
Alkaline Phosphatase: 46 U/L (ref 39–117)
BUN: 14 mg/dL (ref 6–23)
CO2: 29 meq/L (ref 19–32)
Calcium: 9.4 mg/dL (ref 8.4–10.5)
Chloride: 106 meq/L (ref 96–112)
Creatinine, Ser: 0.85 mg/dL (ref 0.40–1.20)
GFR: 72.34 mL/min (ref >60.00)
Glucose, Bld: 83 mg/dL (ref 70–99)
Potassium: 4.2 meq/L (ref 3.5–5.1)
Sodium: 142 meq/L (ref 135–145)
Total Bilirubin: 0.3 mg/dL (ref 0.2–1.2)
Total Protein: 7.1 g/dL (ref 6.0–8.3)

## 2023-08-06 LAB — LIPID PANEL
Cholesterol: 216 mg/dL — ABNORMAL HIGH (ref 0–200)
HDL: 59 mg/dL (ref >39.00)
LDL Cholesterol: 132 mg/dL — ABNORMAL HIGH (ref 0–99)
NonHDL: 156.64
Total CHOL/HDL Ratio: 4
Triglycerides: 124 mg/dL (ref 0.0–149.0)
VLDL: 24.8 mg/dL (ref 0.0–40.0)

## 2023-08-06 LAB — CBC WITH DIFFERENTIAL/PLATELET
Basophils Absolute: 0.1 10*3/uL (ref 0.0–0.1)
Basophils Relative: 1.1 % (ref 0.0–3.0)
Eosinophils Absolute: 0.2 10*3/uL (ref 0.0–0.7)
Eosinophils Relative: 3.2 % (ref 0.0–5.0)
HCT: 37 % (ref 36.0–46.0)
Hemoglobin: 12.4 g/dL (ref 12.0–15.0)
Lymphocytes Relative: 45.4 % (ref 12.0–46.0)
Lymphs Abs: 2.3 10*3/uL (ref 0.7–4.0)
MCHC: 33.4 g/dL (ref 30.0–36.0)
MCV: 93.4 fl (ref 78.0–100.0)
Monocytes Absolute: 0.4 10*3/uL (ref 0.1–1.0)
Monocytes Relative: 7 % (ref 3.0–12.0)
Neutro Abs: 2.2 10*3/uL (ref 1.4–7.7)
Neutrophils Relative %: 43.3 % (ref 43.0–77.0)
Platelets: 340 10*3/uL (ref 150.0–400.0)
RBC: 3.96 Mil/uL (ref 3.87–5.11)
RDW: 14.9 % (ref 11.5–15.5)
WBC: 5.1 10*3/uL (ref 4.0–10.5)

## 2023-08-06 LAB — HEMOGLOBIN A1C: Hgb A1c MFr Bld: 5.4 % (ref 4.6–6.5)

## 2023-08-06 LAB — TSH: TSH: 1.95 u[IU]/mL (ref 0.35–5.50)

## 2023-08-06 NOTE — Patient Instructions (Signed)
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
## 2023-08-07 ENCOUNTER — Encounter: Payer: Self-pay | Admitting: Emergency Medicine

## 2023-08-09 ENCOUNTER — Other Ambulatory Visit: Payer: Self-pay | Admitting: Obstetrics and Gynecology

## 2023-08-09 ENCOUNTER — Ambulatory Visit (INDEPENDENT_AMBULATORY_CARE_PROVIDER_SITE_OTHER): Payer: 59 | Admitting: Obstetrics and Gynecology

## 2023-08-09 ENCOUNTER — Encounter: Payer: Self-pay | Admitting: Obstetrics and Gynecology

## 2023-08-09 ENCOUNTER — Other Ambulatory Visit (HOSPITAL_COMMUNITY)
Admission: RE | Admit: 2023-08-09 | Discharge: 2023-08-09 | Disposition: A | Discharge status: Home / Self Care | Source: Ambulatory Visit | Attending: Obstetrics and Gynecology | Admitting: Obstetrics and Gynecology

## 2023-08-09 VITALS — BP 118/80 | HR 63 | Ht 65.75 in | Wt 150.0 lb

## 2023-08-09 DIAGNOSIS — Z1231 Encounter for screening mammogram for malignant neoplasm of breast: Secondary | ICD-10-CM

## 2023-08-09 DIAGNOSIS — D229 Melanocytic nevi, unspecified: Secondary | ICD-10-CM | POA: Diagnosis not present

## 2023-08-09 DIAGNOSIS — E2839 Other primary ovarian failure: Secondary | ICD-10-CM

## 2023-08-09 DIAGNOSIS — Z01419 Encounter for gynecological examination (general) (routine) without abnormal findings: Secondary | ICD-10-CM | POA: Insufficient documentation

## 2023-08-09 DIAGNOSIS — Z1331 Encounter for screening for depression: Secondary | ICD-10-CM | POA: Diagnosis not present

## 2023-08-09 NOTE — Addendum Note (Signed)
 Addended by: Earley Favor on: 08/09/2023 11:20 AM   Modules accepted: Orders
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
## 2023-08-10 LAB — CYTOLOGY - PAP: Diagnosis: NEGATIVE

## 2023-08-13 ENCOUNTER — Encounter: Payer: Self-pay | Admitting: Obstetrics and Gynecology

## 2023-08-16 ENCOUNTER — Ambulatory Visit
Admission: RE | Admit: 2023-08-16 | Discharge: 2023-08-16 | Disposition: A | Discharge status: Home / Self Care | Source: Ambulatory Visit | Attending: Obstetrics and Gynecology | Admitting: Obstetrics and Gynecology

## 2023-08-16 DIAGNOSIS — Z1231 Encounter for screening mammogram for malignant neoplasm of breast: Secondary | ICD-10-CM

## 2023-08-20 ENCOUNTER — Encounter: Payer: Self-pay | Admitting: Obstetrics and Gynecology

## 2023-10-25 ENCOUNTER — Encounter: Payer: Self-pay | Admitting: Dermatology

## 2023-10-25 ENCOUNTER — Ambulatory Visit: Admitting: Dermatology

## 2023-10-25 DIAGNOSIS — L821 Other seborrheic keratosis: Secondary | ICD-10-CM

## 2023-10-25 DIAGNOSIS — D229 Melanocytic nevi, unspecified: Secondary | ICD-10-CM

## 2023-10-25 DIAGNOSIS — L578 Other skin changes due to chronic exposure to nonionizing radiation: Secondary | ICD-10-CM | POA: Diagnosis not present

## 2023-10-25 DIAGNOSIS — L814 Other melanin hyperpigmentation: Secondary | ICD-10-CM

## 2023-10-25 DIAGNOSIS — I781 Nevus, non-neoplastic: Secondary | ICD-10-CM

## 2023-10-25 DIAGNOSIS — D1801 Hemangioma of skin and subcutaneous tissue: Secondary | ICD-10-CM

## 2023-10-25 NOTE — Progress Notes (Signed)
   New Patient Visit   Subjective  Katie Bond is a 63 y.o. female who presents for the following: Skin Cancer Screening and Full Body Skin   Patient was last seen by a Dermatologist about 5 years ago. She has no personal or family history of skin cancer. She has had moles removed in the past. Patient reports spending time in the sun gardening, and will wear 30 spf sunscreen depending on the strength on the sun.  The patient presents for Total-Body Skin Exam (TBSE) for skin cancer screening and mole check. The patient has spots, moles and lesions to be evaluated, some may be new.  The following portions of the chart were reviewed this encounter and updated as appropriate: medications, allergies, medical history  Review of Systems:  No other skin or systemic complaints except as noted in HPI or Assessment and Plan.  Objective  Well appearing patient in no apparent distress; mood and affect are within normal limits.  A full examination was performed including scalp, head, eyes, ears, nose, lips, neck, chest, axillae, abdomen, back, buttocks, bilateral upper extremities, bilateral lower extremities, hands, feet, fingers, toes, fingernails, and toenails. All findings within normal limits unless otherwise noted below.   Relevant physical exam findings are noted in the Assessment and Plan.    Assessment & Plan   SKIN CANCER SCREENING PERFORMED TODAY.  ACTINIC DAMAGE - Chronic condition, secondary to cumulative UV/sun exposure - diffuse scaly erythematous macules with underlying dyspigmentation - Recommend daily broad spectrum sunscreen SPF 30+ to sun-exposed areas, reapply every 2 hours as needed.  - Staying in the shade or wearing long sleeves, sun glasses (UVA+UVB protection) and wide brim hats (4-inch brim around the entire circumference of the hat) are also recommended for sun protection.  - Call for new or changing lesions.  LENTIGINES, SEBORRHEIC KERATOSES, HEMANGIOMAS - Benign  normal skin lesions - Benign-appearing - Call for any changes  MELANOCYTIC NEVI - Tan-brown and/or pink-flesh-colored symmetric macules and papules - Benign appearing on exam today - Observation - Call clinic for new or changing moles - Recommend daily use of broad spectrum spf 30+ sunscreen to sun-exposed areas.   Spider Veins - Dilated blue, purple or red veins at the lower extremities - Reassured - Smaller vessels can be treated by sclerotherapy (a procedure to inject a medicine into the veins to make them disappear) if desired, but the treatment is not covered by insurance. Larger vessels may be covered if symptomatic and we would refer to vascular surgeon if treatment desired.   Return in about 2 years (around 10/24/2025) for skin check.  Maurine Sovereign, RN, am acting as scribe for Deneise Finlay, MD .   Documentation: I have reviewed the above documentation for accuracy and completeness, and I agree with the above.  Deneise Finlay, MD

## 2023-10-25 NOTE — Patient Instructions (Addendum)
 Varicose Veins/Spider Veins - Dilated blue, purple or red veins at the lower extremities - Reassured - Smaller vessels can be treated by sclerotherapy (a procedure to inject a medicine into the veins to make them disappear) if desired, but the treatment is not covered by insurance. Larger vessels may be covered if symptomatic and we would refer to vascular surgeon if treatment desired.   Some Recommended Sunscreens for Sensitive Skin Include:  Body or All Over Sunscreen (water resistant) EltaMD UV Pure Blue lizard sensitive Sun bum mineral (avoid if sensitive to scent) Aveeno Positively Mineral Neutrogena sheer zinc (Slightly harder to rub in) CVS clear zinc (Slightly harder to rub in) Vanicream mineral sunscreen spf 50+  Face Sunscreen EltaMD UV Elements (tinted) EltaMD UV Restore (tinted or nontinted) EltaMD UV Physical (tinted) CeraVe hydrating sunscreen 50 face (tinted or nontinted) Colorescience Sunforgettable Total Protection Face Shield (good for most skin tones) La Roche Posay Mineral Tinted Cotz Flawless Complexion   Powder Sunscreen (Nice for reapplying or applying on the go) Colorescience Sunforgettable Total Protection Brush on Shield (available in different tints)   Important Information  Due to recent changes in healthcare laws, you may see results of your pathology and/or laboratory studies on MyChart before the doctors have had a chance to review them. We understand that in some cases there may be results that are confusing or concerning to you. Please understand that not all results are received at the same time and often the doctors may need to interpret multiple results in order to provide you with the best plan of care or course of treatment. Therefore, we ask that you please give us  2 business days to thoroughly review all your results before contacting the office for clarification. Should we see a critical lab result, you will be contacted sooner.   If You Need  Anything After Your Visit  If you have any questions or concerns for your doctor, please call our main line at [phone] If no one answers, please leave a voicemail as directed and we will return your call as soon as possible. Messages left after 4 pm will be answered the following business day.   You may also send us  a message via MyChart. We typically respond to MyChart messages within 1-2 business days.  For prescription refills, please ask your pharmacy to contact our office. Our fax number is [phone].  If you have an urgent issue when the clinic is closed that cannot wait until the next business day, you can page your doctor at the number below.    Please note that while we do our best to be available for urgent issues outside of office hours, we are not available 24/7.   If you have an urgent issue and are unable to reach us , you may choose to seek medical care at your doctor's office, retail clinic, urgent care center, or emergency room.  If you have a medical emergency, please immediately call 911 or go to the emergency department. In the event of inclement weather, please call our main line at [phone] for an update on the status of any delays or closures.  Dermatology Medication Tips: Please keep the boxes that topical medications come in in order to help keep track of the instructions about where and how to use these. Pharmacies typically print the medication instructions only on the boxes and not directly on the medication tubes.   If your medication is too expensive, please contact our office at [phone] or send us  a  message through MyChart.   We are unable to tell what your co-pay for medications will be in advance as this is different depending on your insurance coverage. However, we may be able to find a substitute medication at lower cost or fill out paperwork to get insurance to cover a needed medication.   If a prior authorization is required to get your  medication covered by your insurance company, please allow us  1-2 business days to complete this process.  Drug prices often vary depending on where the prescription is filled and some pharmacies may offer cheaper prices.  The website www.goodrx.com contains coupons for medications through different pharmacies. The prices here do not account for what the cost may be with help from insurance (it may be cheaper with your insurance), but the website can give you the price if you did not use any insurance.  - You can print the associated coupon and take it with your prescription to the pharmacy.  - You may also stop by our office during regular business hours and pick up a GoodRx coupon card.  - If you need your prescription sent electronically to a different pharmacy, notify our office through [facility] or by phone at [phone]

## 2023-12-28 IMAGING — MG MM DIGITAL SCREENING BILAT W/ TOMO AND CAD
6 of 10 series · 6 of 30 positions shown · non-contrast
Comparison: Previous exam(s).

CLINICAL DATA: Screening.

EXAM:
DIGITAL SCREENING BILATERAL MAMMOGRAM WITH TOMOSYNTHESIS AND CAD
TECHNIQUE: Bilateral screening digital craniocaudal and mediolateral oblique
mammograms were obtained. Bilateral screening digital breast
tomosynthesis was performed. The images were evaluated with
computer-aided detection.

[L MLO synth-2D]
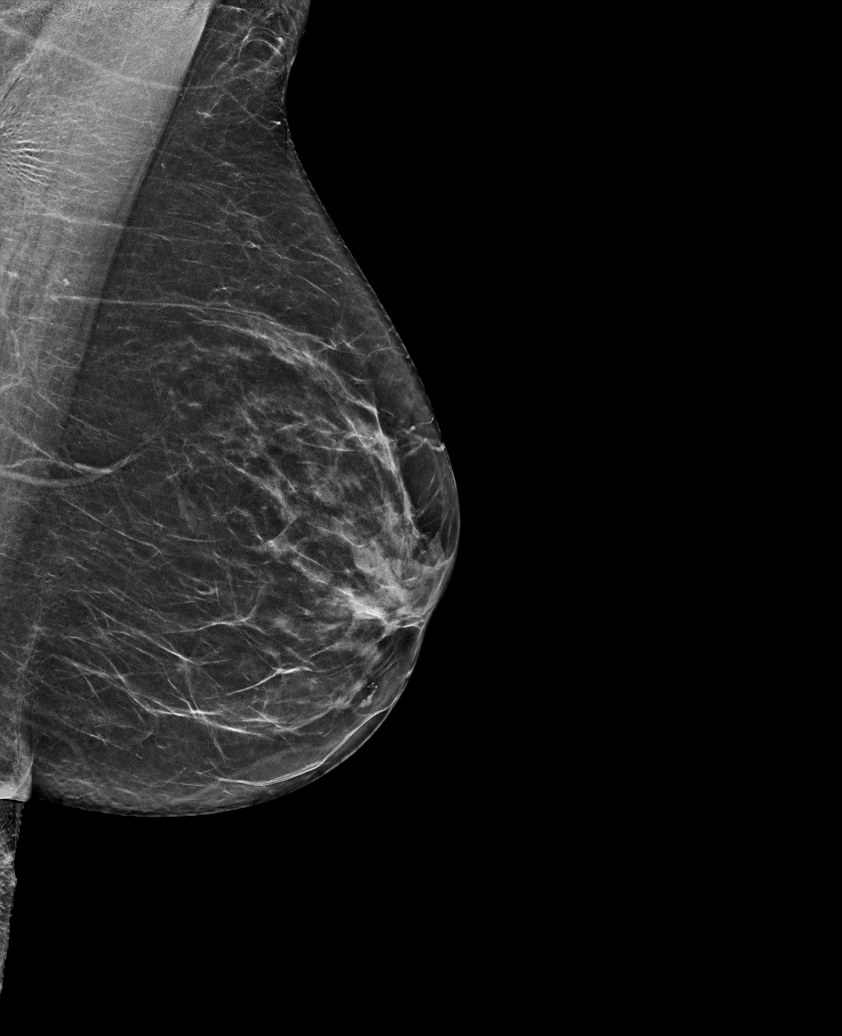

[R MLO synth-2D (1 of 2)]
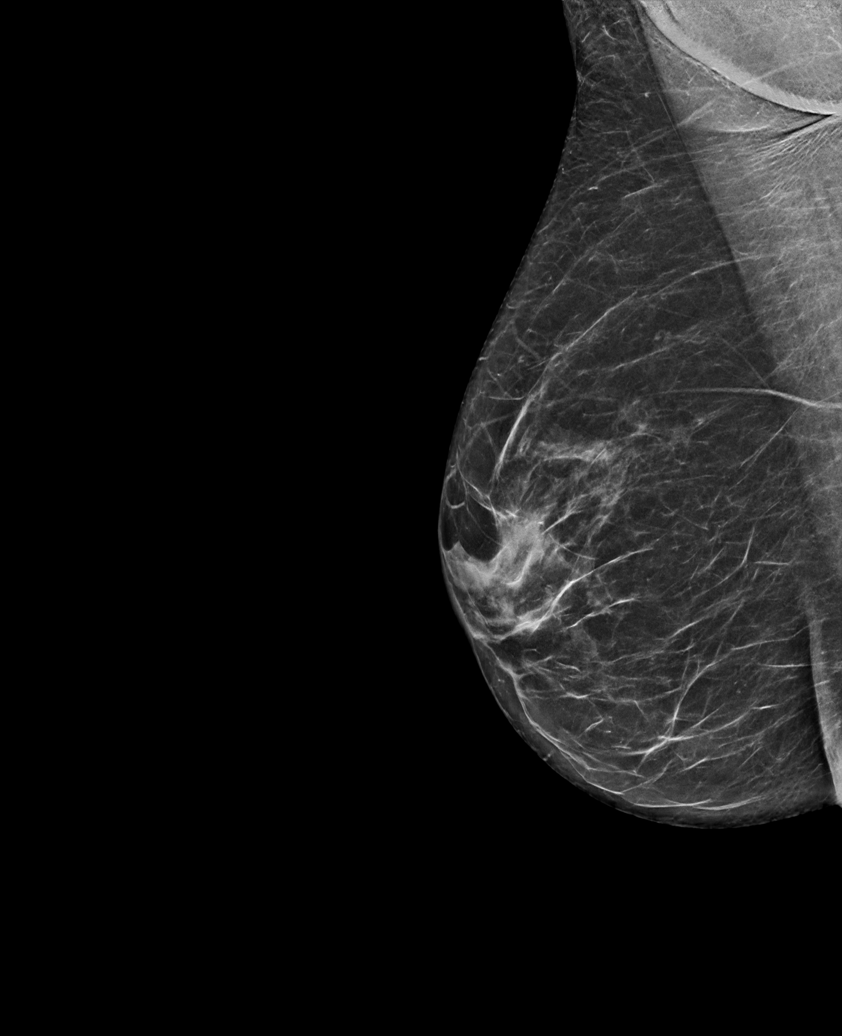

[R CC synth-2D]
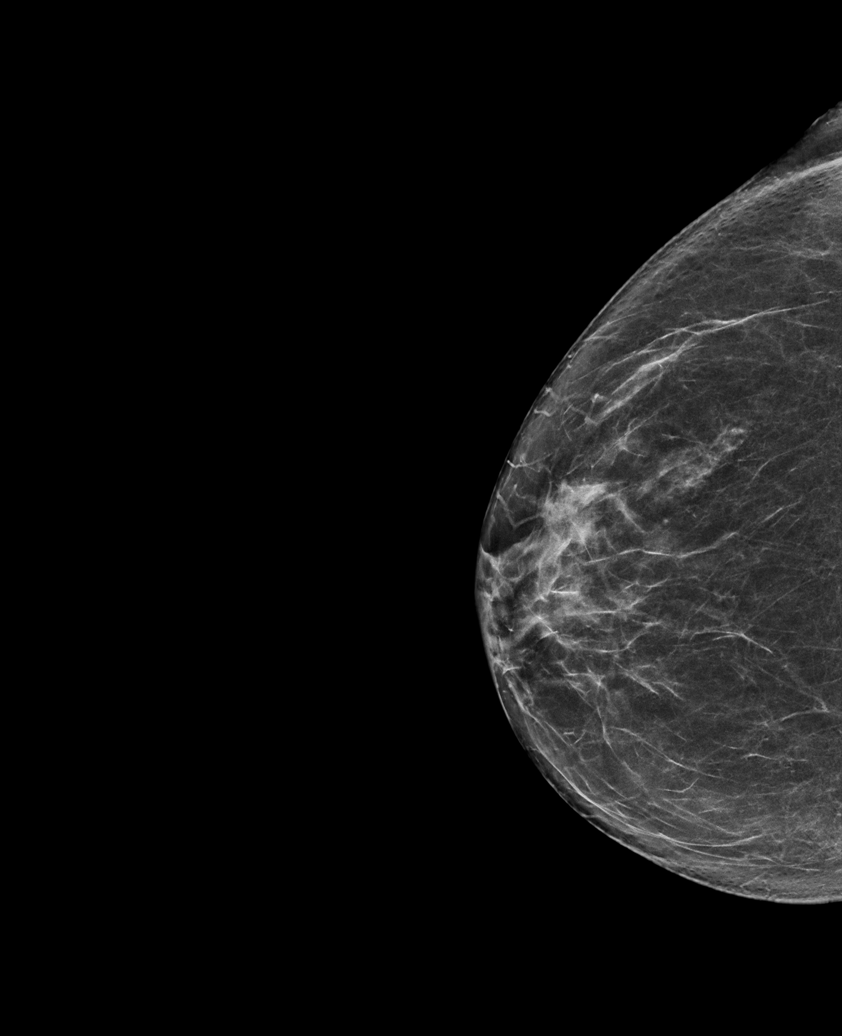

[L CC synth-2D]
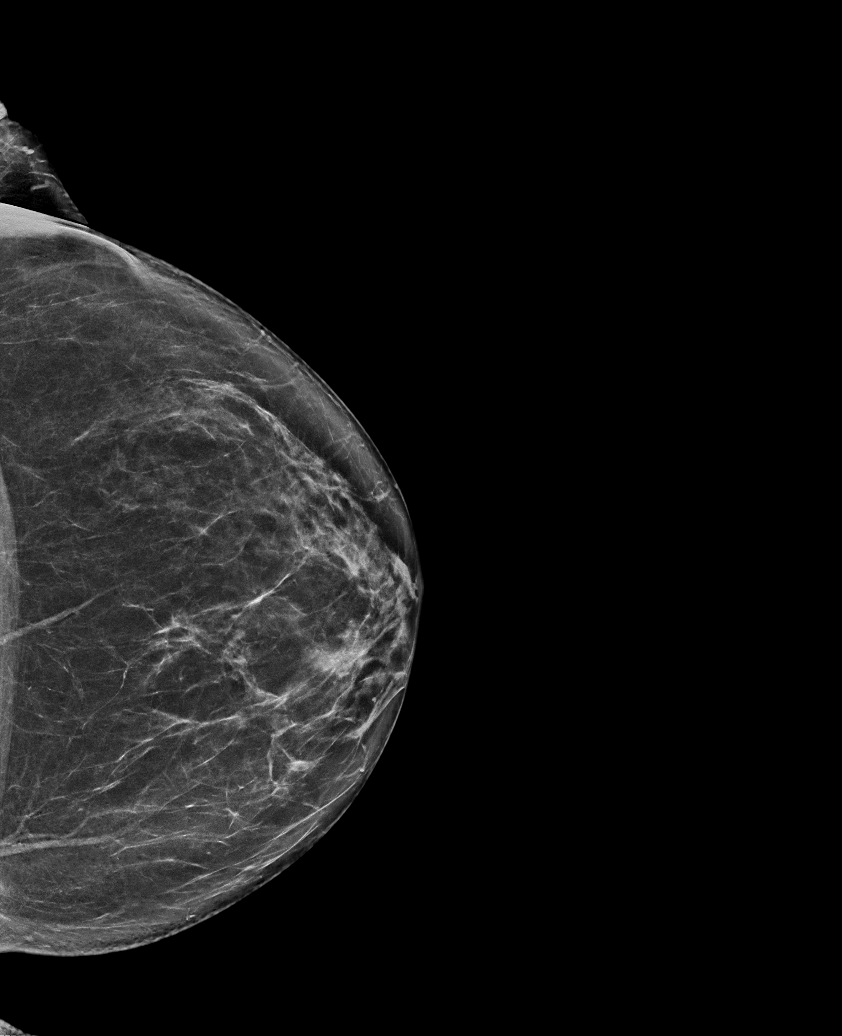

[R MLO synth-2D (2 of 2)]
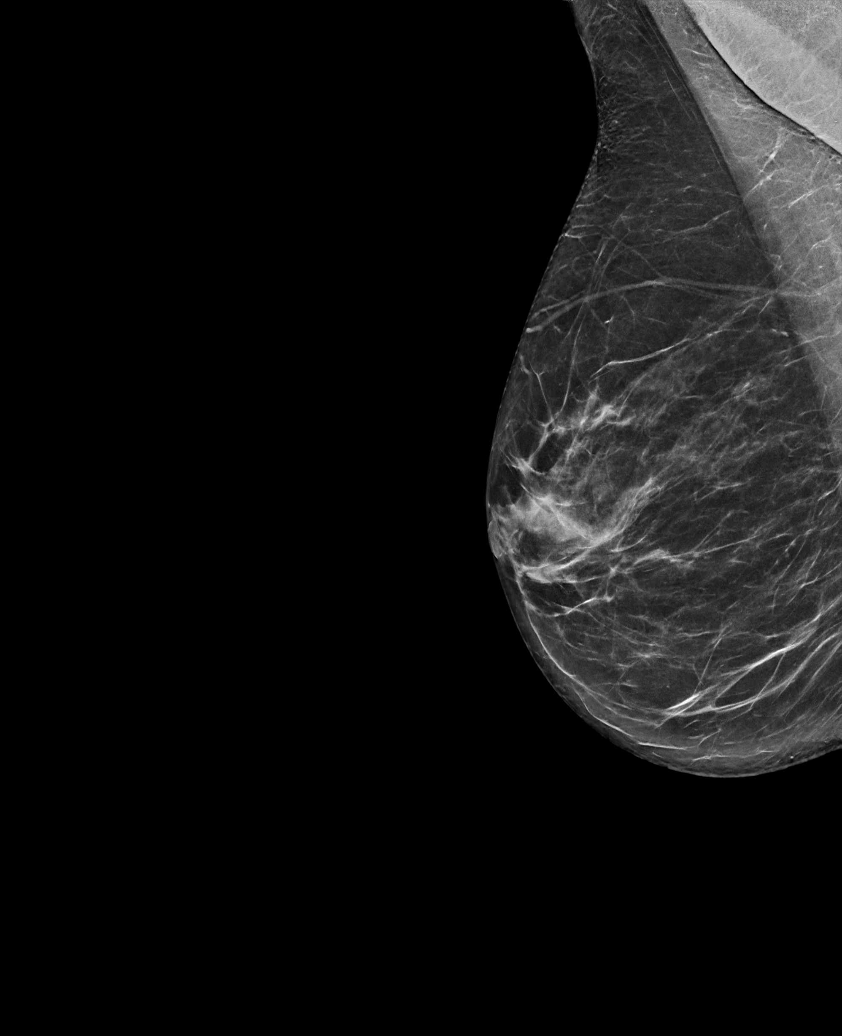

[R MLO tomo · tomo slice 35/69.0]
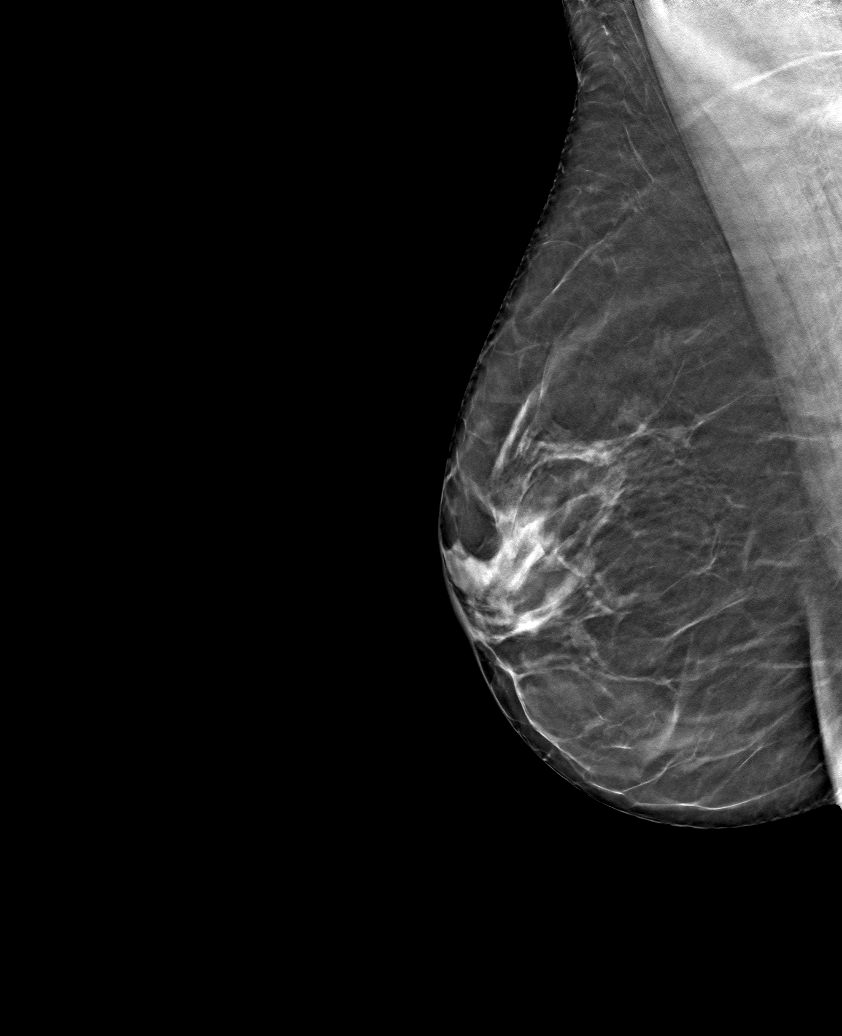

[6 of 30 positions shown; findings below may reference images not displayed]

ACR Breast Density Category b: There are scattered areas of
fibroglandular density.
FINDINGS: There are no findings suspicious for malignancy.
IMPRESSION: No mammographic evidence of malignancy. A result letter of this
screening mammogram will be mailed directly to the patient.

RECOMMENDATION:
Screening mammogram in one year. (Code:51-O-LD2)

BI-RADS CATEGORY  1: Negative.

## 2024-01-04 ENCOUNTER — Encounter: Payer: Self-pay | Admitting: Emergency Medicine

## 2024-01-04 ENCOUNTER — Ambulatory Visit
Admission: EM | Admit: 2024-01-04 | Discharge: 2024-01-04 | Disposition: A | Discharge status: Home / Self Care | Attending: Family Medicine | Admitting: Family Medicine

## 2024-01-04 DIAGNOSIS — H8113 Benign paroxysmal vertigo, bilateral: Secondary | ICD-10-CM

## 2024-01-04 MED ORDER — MECLIZINE HCL 12.5 MG PO TABS: 12.5000 mg | ORAL_TABLET | Freq: Three times a day (TID) | ORAL | 0 refills | Status: AC | PRN

## 2024-01-04 MED ORDER — DIAZEPAM 5 MG PO TABS
ORAL_TABLET | ORAL | 0 refills | Status: AC
Start: 2024-01-04 — End: ?
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
## 2024-01-04 NOTE — ED Triage Notes (Signed)
 Pt presents c/o dizziness since last night. Pt says she went to stand up and felt out of whack. Pt reports when she has felt this way previously it was due to an ear infection.

## 2024-01-04 NOTE — Discharge Instructions (Addendum)
 At the start of a vertigo episode like what you are experiencing now take 1 tablet of diazepam .  It can cause drowsiness and sleepiness so take that into account for the day.  Thereafter if you continue to have some symptoms, you can try meclizine .  This medication similarly can cause drowsiness and sleepiness.  If you continue to have symptoms, follow-up with your PCP to obtain a referral to an ENT specialist.  I have included information for you to perform at home Epley maneuvers, these are exercises that can help tone down the dizziness.

## 2024-01-08 ENCOUNTER — Other Ambulatory Visit (HOSPITAL_BASED_OUTPATIENT_CLINIC_OR_DEPARTMENT_OTHER): Admitting: Radiology

## 2024-01-16 ENCOUNTER — Inpatient Hospital Stay (HOSPITAL_BASED_OUTPATIENT_CLINIC_OR_DEPARTMENT_OTHER)
Admission: RE | Admit: 2024-01-16 | Discharge: 2024-01-16 | Discharge status: Home / Self Care | Source: Ambulatory Visit | Attending: Obstetrics and Gynecology | Admitting: Radiology

## 2024-01-16 DIAGNOSIS — Z01419 Encounter for gynecological examination (general) (routine) without abnormal findings: Secondary | ICD-10-CM

## 2024-01-16 DIAGNOSIS — E2839 Other primary ovarian failure: Secondary | ICD-10-CM

## 2024-01-17 ENCOUNTER — Encounter: Payer: Self-pay | Admitting: Obstetrics and Gynecology

## 2024-01-17 ENCOUNTER — Ambulatory Visit: Payer: Self-pay | Admitting: Obstetrics and Gynecology

## 2024-04-09 ENCOUNTER — Other Ambulatory Visit

## 2024-05-12 ENCOUNTER — Other Ambulatory Visit (HOSPITAL_BASED_OUTPATIENT_CLINIC_OR_DEPARTMENT_OTHER)

## 2024-08-11 ENCOUNTER — Ambulatory Visit: Admitting: Obstetrics and Gynecology
